# Patient Record
Sex: Male | Born: 1975
Health system: Southern US, Community
[De-identification: ages and names within clinical notes are randomized; demographics above are authoritative.]

## PROBLEM LIST (undated history)

## (undated) DIAGNOSIS — R51 Headache: Secondary | ICD-10-CM

## (undated) DIAGNOSIS — R519 Headache, unspecified: Secondary | ICD-10-CM

## (undated) DIAGNOSIS — I10 Essential (primary) hypertension: Secondary | ICD-10-CM

## (undated) HISTORY — DX: Essential (primary) hypertension: I10

## (undated) HISTORY — DX: Headache, unspecified: R51.9

## (undated) HISTORY — DX: Headache: R51

## (undated) HISTORY — PX: WISDOM TOOTH EXTRACTION: SHX21

## (undated) HISTORY — PX: NASAL SEPTUM SURGERY: SHX37

---

## 2004-08-06 ENCOUNTER — Ambulatory Visit (HOSPITAL_COMMUNITY): Admission: RE | Admit: 2004-08-06 | Discharge: 2004-08-06 | Payer: Self-pay | Admitting: Otolaryngology

## 2005-11-27 IMAGING — CT CT ORBIT/TEMPORAL/IAC W/O CM
1 of 5 series · 14 of 30 positions shown, 18 images · IV contrast (agent unspecified)
Comparison: none

CLINICAL DATA: 29-year-old with right ear drainage for 2 years.
 CT TEMPORAL BONES WITHOUT CONTRAST:
 High resolution CT imaging of the temporal bones was performed.  The external ear canal, tympanic membrane, and middle ear cavity appear normal on the right side.  The bony ossicles are intact.  There is no erosion of the scutum.  There are no soft tissue masses, and the epitympanum is clear.  The tegumen tympani is intact.  Specifically no evidence for cholesteatoma.  The inner ear structures appear normal.  There are some scattered mastoid air cells inferiorly that are opacified which may suggest mild chronic mastoiditis.  The mastoid antrum is clear.  The left temporal bone is normal in appearance.  The carotid canal and jugular foramen appear normal bilaterally.  The visualized paranasal sinuses are clear.

[Series 8533: — · axial · 0.35mm/px · z∈[-631,-522]mm · 14 of 729 slices shown, 18 images]
[im 46/729  brain]
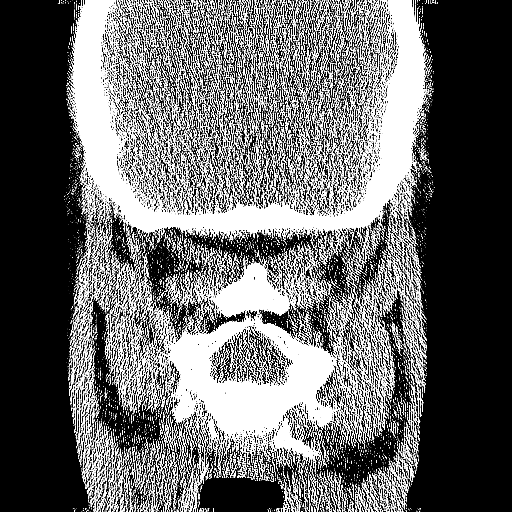
[im 46/729  bone]
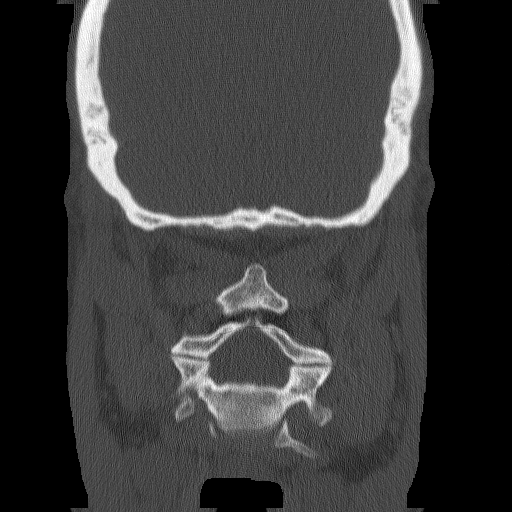
[im 92/729  bone]
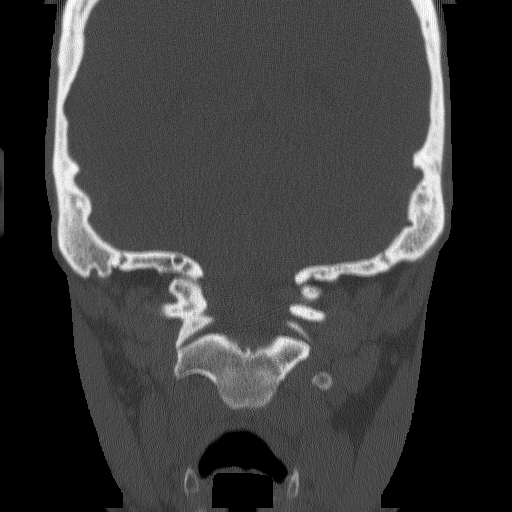
[im 137/729  bone]
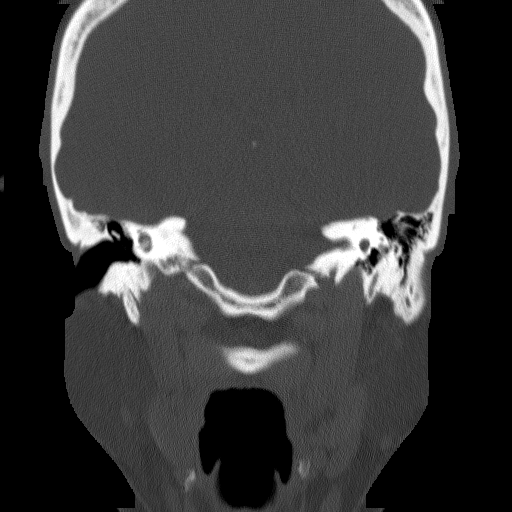
[im 183/729  bone]
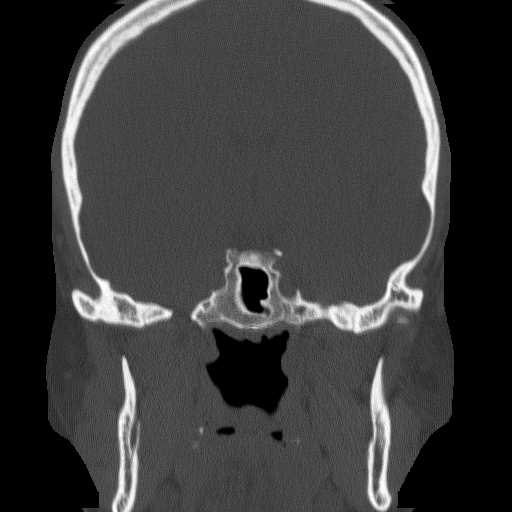
[im 228/729  brain]
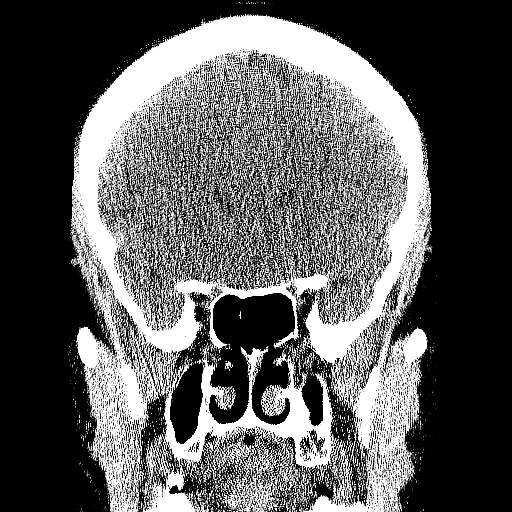
[im 228/729  bone]
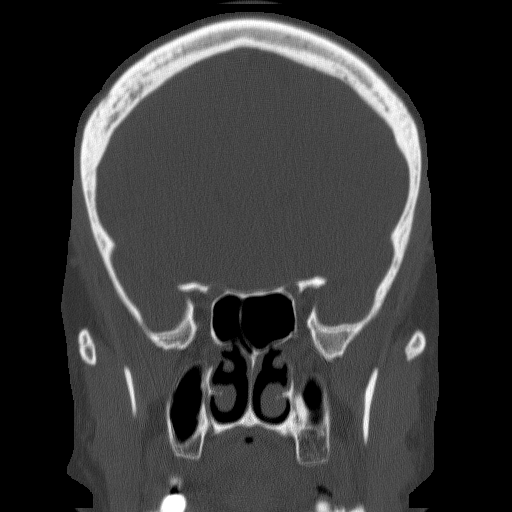
[im 274/729  bone]
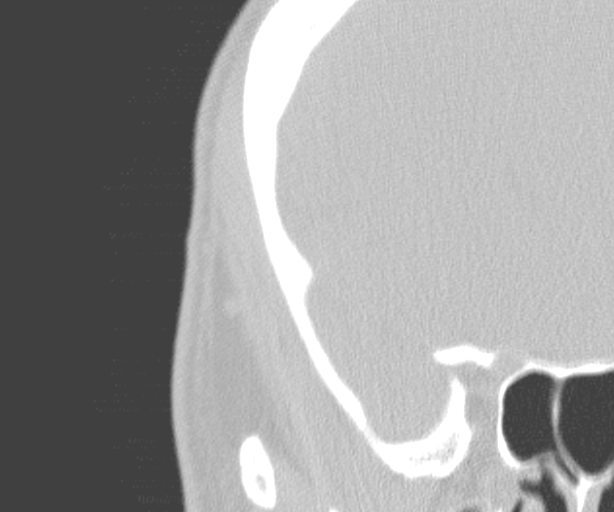
[im 319/729  bone]
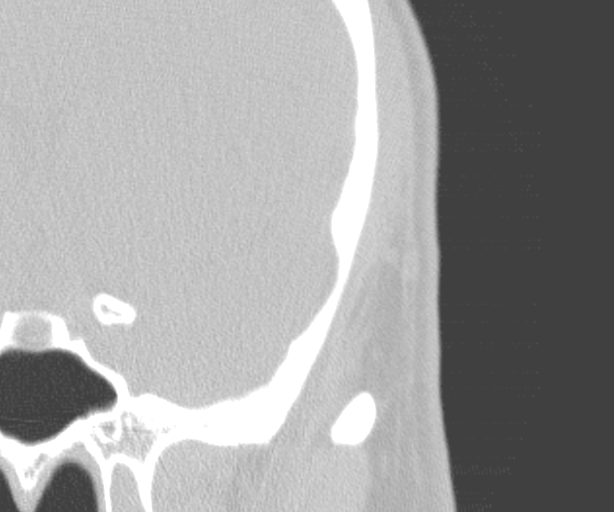
[im 410/729  bone]
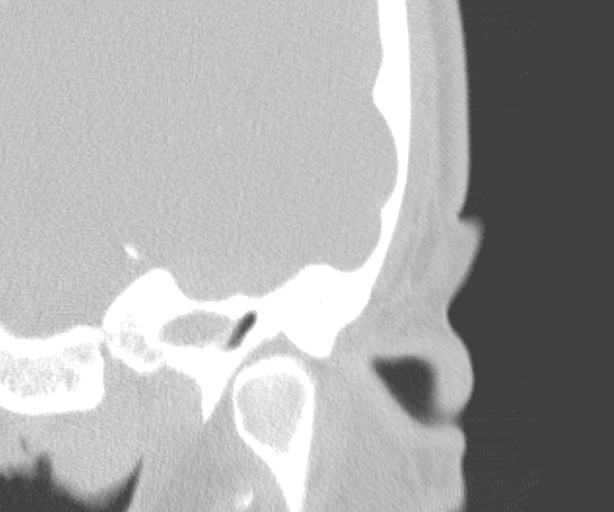
[im 456/729  brain]
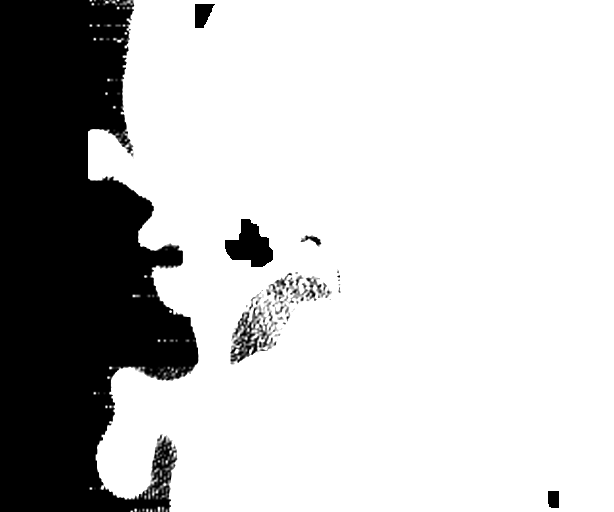
[im 456/729  bone]
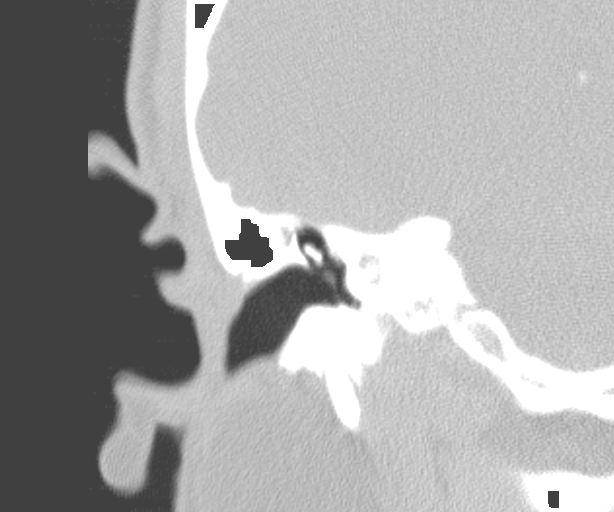
[im 501/729  bone]
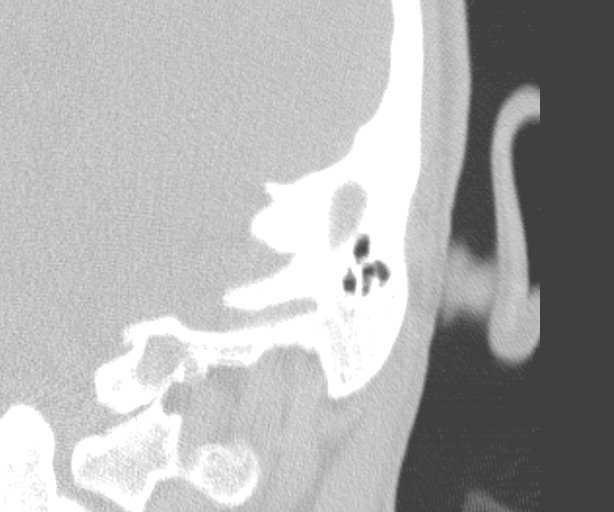
[im 547/729  bone]
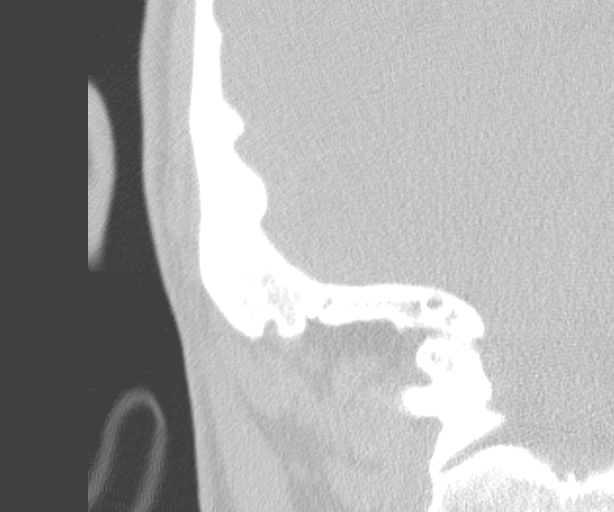
[im 592/729  bone]
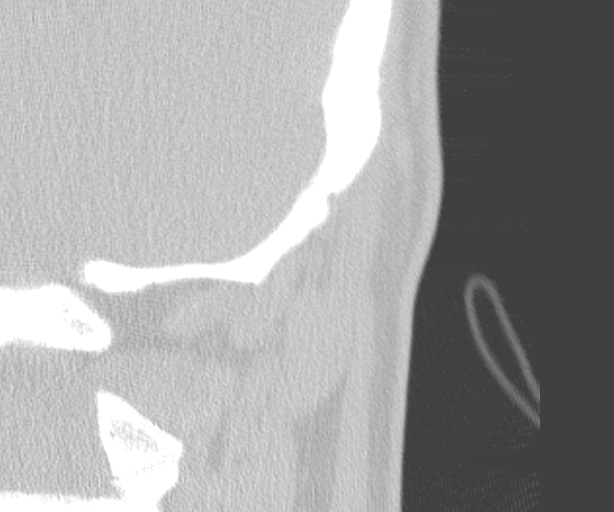
[im 638/729  brain]
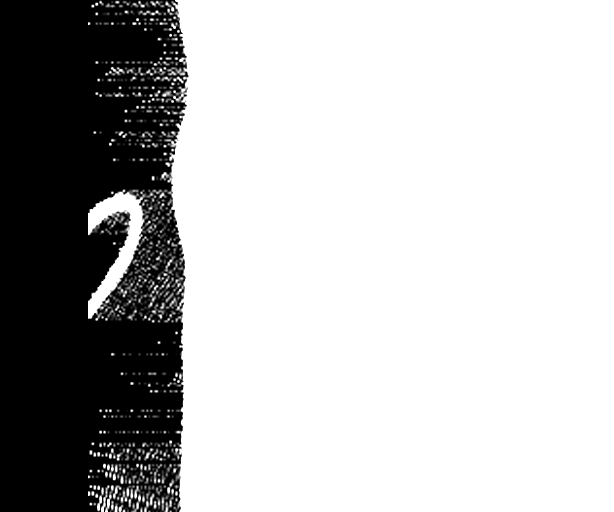
[im 638/729  bone]
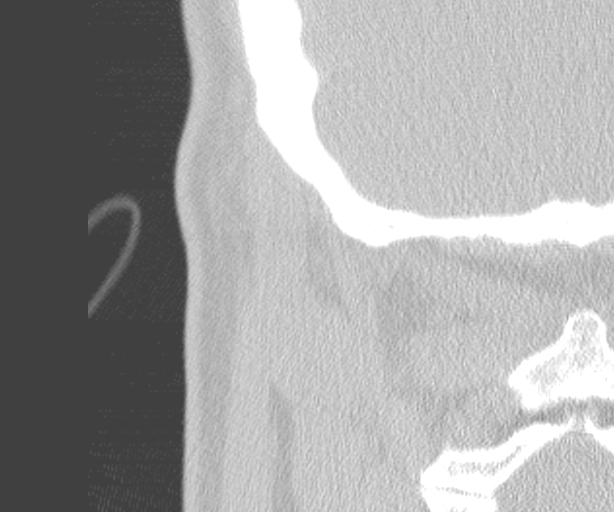
[im 683/729  bone]
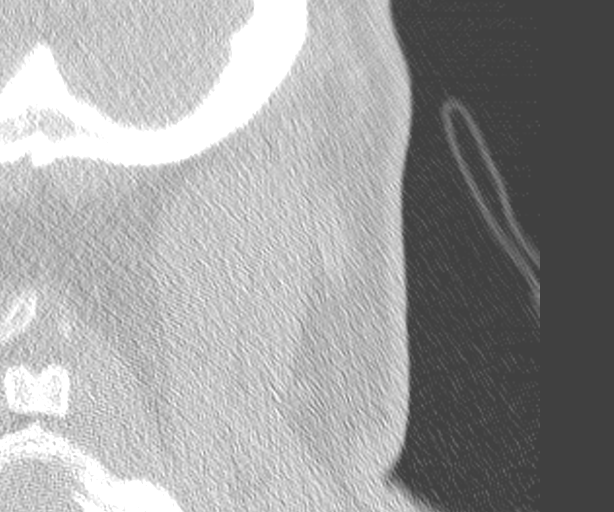

[14 of 30 positions shown; findings below may reference images not displayed]

IMPRESSION: 1.  Mild probable chronic mastoiditis inferiorly on the right side.  The mastoid antrum is clear.  The middle ear cavity appears normal.  
 2.  Normal appearance of the left temporal bone.

## 2017-06-01 ENCOUNTER — Encounter (INDEPENDENT_AMBULATORY_CARE_PROVIDER_SITE_OTHER): Payer: Self-pay

## 2017-06-01 ENCOUNTER — Ambulatory Visit: Payer: 59 | Admitting: Neurology

## 2017-06-01 ENCOUNTER — Encounter: Payer: Self-pay | Admitting: Neurology

## 2017-06-01 VITALS — BP 124/82 | HR 77 | Ht 71.0 in | Wt 241.0 lb

## 2017-06-01 DIAGNOSIS — G43009 Migraine without aura, not intractable, without status migrainosus: Secondary | ICD-10-CM | POA: Diagnosis not present

## 2017-06-01 DIAGNOSIS — R5383 Other fatigue: Secondary | ICD-10-CM | POA: Diagnosis not present

## 2017-06-01 DIAGNOSIS — G43709 Chronic migraine without aura, not intractable, without status migrainosus: Secondary | ICD-10-CM | POA: Diagnosis not present

## 2017-06-01 DIAGNOSIS — R0683 Snoring: Secondary | ICD-10-CM | POA: Diagnosis not present

## 2017-06-01 DIAGNOSIS — R519 Headache, unspecified: Secondary | ICD-10-CM

## 2017-06-01 DIAGNOSIS — R51 Headache with orthostatic component, not elsewhere classified: Secondary | ICD-10-CM

## 2017-06-01 DIAGNOSIS — G8929 Other chronic pain: Secondary | ICD-10-CM

## 2017-06-01 MED ORDER — TOPIRAMATE 50 MG PO TABS: 50.0000 mg | ORAL_TABLET | Freq: Every day | ORAL | 11 refills | Status: DC

## 2017-06-01 MED ORDER — RIZATRIPTAN BENZOATE 10 MG PO TABS: 10.0000 mg | ORAL_TABLET | ORAL | 11 refills | Status: DC | PRN

## 2017-06-01 NOTE — Progress Notes (Signed)
[facility]    Provider:  Dr Lucia Gaskins Referring Provider: Ignatius Specking, MD Primary Care Physician:  Ignatius Specking, MD  CC: Headaches  HPI:  Carlos Bell is a 42 y.o. male here as a referral from Dr. Sherril Croon for headaches.  Past medical history hypertension, depression and anxiety. 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 He has a constant headache in the last year, wakes up with it, goes to bed with it, daily headaches. No previous history of headaches. Topamax has helped. He was taking them 4-6x a day (discussed rebound headache). Topamax has helped. The headaches are still daily but improved with the Topamax. He always wakes up with a headache. He snores heavily at night. He is tired throughout the day, wakes up tired. The headaches are in the front and in the back, throbbing and pressure, he has significant light and sound sensitivity, has to lay down, movement makes it worse. Most days are moderate headaches and 8 or more are migrainous can last all day if untreated.   Reviewed notes, labs and imaging from outside physicians, which showed:  Personally reviewed images and agree with the following CT orbit/temporal: Clinical Data: 42 year old with right ear drainage for 2 years.  CT TEMPORAL BONES WITHOUT CONTRAST:  High resolution CT imaging of the temporal bones was performed. The external ear canal, tympanic membrane, and middle ear cavity appear normal on the right side. The bony ossicles are intact. There is no erosion of the scutum. There are no soft tissue masses, and the epitympanum is clear. The tegumen tympani is intact. Specifically no evidence for cholesteatoma. The inner ear structures appear normal. There are some scattered mastoid air cells inferiorly that are opacified which may suggest mild chronic mastoiditis. The mastoid antrum is clear. The left temporal bone is  normal in appearance. The carotid canal and jugular foramen appear normal bilaterally. The visualized paranasal sinuses are clear.  IMPRESSION:  1. Mild probable chronic mastoiditis inferiorly on the right side. The mastoid antrum is clear. The middle ear cavity appears normal.   2. Normal appearance of the left temporal bone.  Reviewed referring physician notes.  Patient presented headache intake.  Symptoms included headache and nausea, no vomiting have been ongoing for at least 11 hours.  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  Neurologic exam was normal  Review of Systems: Patient complains of symptoms per HPI as well as the following symptoms: headache. Pertinent negatives and positives per HPI. All others negative.   Social History   Socioeconomic History  . Marital status: Married    Spouse name: Not on file  . Number of children: Not on file  . Years of education: Not on file  . Highest education level: Not on file  Social Needs  . Financial resource strain: Not on file  . Food insecurity - worry: Not on file  . Food insecurity - inability: Not on file  . Transportation needs - medical: Not on file  . Transportation needs - non-medical: Not on file  Occupational History  . Not on file  Tobacco Use  . Smoking status: Never Smoker  . Smokeless tobacco: Never Used  Substance and Sexual Activity  . Alcohol use: Yes    Frequency: Never    Comment: occasional beer once a month  . Drug use: No  . Sexual activity:  Not on file  Other Topics Concern  . Not on file  Social History Narrative  . Not on file    Family History  Problem Relation Age of Onset  . Cervical cancer Mother   . Cancer Father     Past Medical History:  Diagnosis Date  . Headache   . Hypertension     Past Surgical History:  Procedure Laterality Date  . NASAL SEPTUM SURGERY    . WISDOM TOOTH EXTRACTION      Current Outpatient Medications  Medication Sig Dispense Refill  .  topiramate (TOPAMAX) 50 MG tablet Take 1 tablet (50 mg total) by mouth daily. 30 tablet 11  . rizatriptan (MAXALT) 10 MG tablet Take 1 tablet (10 mg total) by mouth as needed for migraine. repeat in 2 hours if needed. Max 2x in one day. Take at onset of migraines. 10 tablet 11   No current facility-administered medications for this visit.     Allergies as of 06/01/2017 - Review Complete 06/01/2017  Allergen Reaction Noted  . Zithromax [azithromycin] Rash 05/31/2017    Vitals: BP 124/82   Pulse 77   Ht 5\' 11"  (1.803 m)   Wt 241 lb (109.3 kg)   BMI 33.61 kg/m  Last Weight:  Wt Readings from Last 1 Encounters:  06/01/17 241 lb (109.3 kg)   Last Height:   Ht Readings from Last 1 Encounters:  06/01/17 5\' 11"  (1.803 m)   Physical exam: Exam: Gen: NAD, conversant, well nourised, obese, well groomed                     CV: RRR, no MRG. No Carotid Bruits. No peripheral edema, warm, nontender Eyes: Conjunctivae clear without exudates or hemorrhage  Neuro: Detailed Neurologic Exam  Speech:    Speech is normal; fluent and spontaneous with normal comprehension.  Cognition:    The patient is oriented to person, place, and time;     recent and remote memory intact;     language fluent;     normal attention, concentration,     fund of knowledge Cranial Nerves:    The pupils are equal, round, and reactive to light. The fundi are normal and spontaneous venous pulsations are present. Visual fields are full to finger confrontation. Extraocular movements are intact. Trigeminal sensation is intact and the muscles of mastication are normal. The face is symmetric. The palate elevates in the midline. Hearing intact. Voice is normal. Shoulder shrug is normal. The tongue has normal motion without fasciculations.   Coordination:    Normal finger to nose and heel to shin. Normal rapid alternating movements.   Gait:    Heel-toe and tandem gait are normal.   Motor Observation:    No asymmetry,  no atrophy, and no involuntary movements noted. Tone:    Normal muscle tone.    Posture:    Posture is normal. normal erect    Strength:    Strength is V/V in the upper and lower limbs.      Sensation: intact to LT     Reflex Exam:  DTR's:    Deep tendon reflexes in the upper and lower extremities are normal bilaterally.   Toes:    The toes are downgoing bilaterally.   Clonus:    Clonus is absent.       Assessment/Plan:  42 year old with intractable daily migraines and headaches, May be migraines however given concerning symptoms need full workup  Snoring, not sleeping well, morning  headaches, exhausted when he wakes up, obesity: sleep evaluation for OSA MRI brain due to positional, morning and occipital headaches to eval for space-occupying mass, lesion, chiari or other intracranial etiologies Labs today Increase Topiramate to 50mg  at bedtime Rizatriptan (Maxalt): Please take one tablet at the onset of your headache. If it does not improve the symptoms please take one additional tablet. Do not take more then 2 tablets in 24hrs. Do not take use more then 2 to 3 times in a week.  Discussed: To prevent or relieve headaches, try the following: Cool Compress. Lie down and place a cool compress on your head.  Avoid headache triggers. If certain foods or odors seem to have triggered your migraines in the past, avoid them. A headache diary might help you identify triggers.  Include physical activity in your daily routine. Try a daily walk or other moderate aerobic exercise.  Manage stress. Find healthy ways to cope with the stressors, such as delegating tasks on your to-do list.  Practice relaxation techniques. Try deep breathing, yoga, massage and visualization.  Eat regularly. Eating regularly scheduled meals and maintaining a healthy diet might help prevent headaches. Also, drink plenty of fluids.  Follow a regular sleep schedule. Sleep deprivation might contribute to  headaches Consider biofeedback. With this mind-body technique, you learn to control certain bodily functions - such as muscle tension, heart rate and blood pressure - to prevent headaches or reduce headache pain.    Proceed to emergency room if you experience new or worsening symptoms or symptoms do not resolve, if you have new neurologic symptoms or if headache is severe, or for any concerning symptom.   Provided education and documentation from American headache Society toolbox including articles on: chronic migraine medication overuse headache, chronic migraines, prevention of migraines, behavioral and other nonpharmacologic treatments for headache.  Cc: Dr. Sherril CroonVyas  Orders Placed This Encounter  Procedures  . MR BRAIN W WO CONTRAST  . Comprehensive metabolic panel  . CBC  . TSH  . Ambulatory referral to Sleep Studies     Naomie DeanAntonia Oaklee Sunga, MD  [facility] [address] [place], [place] [id number]  Phone [phone] Fax [phone]

## 2017-06-01 NOTE — Patient Instructions (Addendum)
Increase Topiramate to 50mg  at bedtime Rizatriptan (Maxalt): Please take one tablet at the onset of your headache. If it does not improve the symptoms please take one additional tablet. Do not take more then 2 tablets in 24hrs. Do not take use more then 2 to 3 times in a week. MRI brain Labs Refer to sleep doctor   Migraine Headache A migraine headache is an intense, throbbing pain on one side or both sides of the head. Migraines may also cause other symptoms, such as nausea, vomiting, and sensitivity to light and noise. What are the causes? Doing or taking certain things may also trigger migraines, such as:  Alcohol.  Smoking.  Medicines, such as: ? Medicine used to treat chest pain (nitroglycerine). ? Birth control pills. ? Estrogen pills. ? Certain blood pressure medicines.  Aged cheeses, chocolate, or caffeine.  Foods or drinks that contain nitrates, glutamate, aspartame, or tyramine.  Physical activity.  Other things that may trigger a migraine include:  Menstruation.  Pregnancy.  Hunger.  Stress, lack of sleep, too much sleep, or fatigue.  Weather changes.  What increases the risk? The following factors may make you more likely to experience migraine headaches:  Age. Risk increases with age.  Family history of migraine headaches.  Being Caucasian.  Depression and anxiety.  Obesity.  Being a woman.  Having a hole in the heart (patent foramen ovale) or other heart problems.  What are the signs or symptoms? The main symptom of this condition is pulsating or throbbing pain. Pain may:  Happen in any area of the head, such as on one side or both sides.  Interfere with daily activities.  Get worse with physical activity.  Get worse with exposure to bright lights or loud noises.  Other symptoms may include:  Nausea.  Vomiting.  Dizziness.  General sensitivity to bright lights, loud noises, or smells.  Before you get a migraine, you may get  warning signs that a migraine is developing (aura). An aura may include:  Seeing flashing lights or having blind spots.  Seeing bright spots, halos, or zigzag lines.  Having tunnel vision or blurred vision.  Having numbness or a tingling feeling.  Having trouble talking.  Having muscle weakness.  How is this diagnosed? A migraine headache can be diagnosed based on:  Your symptoms.  A physical exam.  Tests, such as CT scan or MRI of the head. These imaging tests can help rule out other causes of headaches.  Taking fluid from the spine (lumbar puncture) and analyzing it (cerebrospinal fluid analysis, or CSF analysis).  How is this treated? A migraine headache is usually treated with medicines that:  Relieve pain.  Relieve nausea.  Prevent migraines from coming back.  Treatment may also include:  Acupuncture.  Lifestyle changes like avoiding foods that trigger migraines.  Follow these instructions at home: Medicines  Take over-the-counter and prescription medicines only as told by your health care provider.  Do not drive or use heavy machinery while taking prescription pain medicine.  To prevent or treat constipation while you are taking prescription pain medicine, your health care provider may recommend that you: ? Drink enough fluid to keep your urine clear or pale yellow. ? Take over-the-counter or prescription medicines. ? Eat foods that are high in fiber, such as fresh fruits and vegetables, whole grains, and beans. ? Limit foods that are high in fat and processed sugars, such as fried and sweet foods. Lifestyle  Avoid alcohol use.  Do not use  any products that contain nicotine or tobacco, such as cigarettes and e-cigarettes. If you need help quitting, ask your health care provider.  Get at least 8 hours of sleep every night.  Limit your stress. General instructions   Keep a journal to find out what may trigger your migraine headaches. For example,  write down: ? What you eat and drink. ? How much sleep you get. ? Any change to your diet or medicines.  If you have a migraine: ? Avoid things that make your symptoms worse, such as bright lights. ? It may help to lie down in a dark, quiet room. ? Do not drive or use heavy machinery. ? Ask your health care provider what activities are safe for you while you are experiencing symptoms.  Keep all follow-up visits as told by your health care provider. This is important. Contact a health care provider if:  You develop symptoms that are different or more severe than your usual migraine symptoms. Get help right away if:  Your migraine becomes severe.  You have a fever.  You have a stiff neck.  You have vision loss.  Your muscles feel weak or like you cannot control them.  You start to lose your balance often.  You develop trouble walking.  You faint. This information is not intended to replace advice given to you by your health care provider. Make sure you discuss any questions you have with your health care provider. Document Released: 03/21/2005 Document Revised: 10/09/2015 Document Reviewed: 09/07/2015 Elsevier Interactive Patient Education  2017 Elsevier Inc.  Rizatriptan tablets What is this medicine? RIZATRIPTAN (rye za TRIP tan) is used to treat migraines with or without aura. An aura is a strange feeling or visual disturbance that warns you of an attack. It is not used to prevent migraines. This medicine may be used for other purposes; ask your health care provider or pharmacist if you have questions. COMMON BRAND NAME(S): Maxalt What should I tell my health care provider before I take this medicine? They need to know if you have any of these conditions: -bowel disease or colitis -diabetes -family history of heart disease -fast or irregular heart beat -heart or blood vessel disease, angina (chest pain), or previous heart attack -high blood pressure -high  cholesterol -history of stroke, transient ischemic attacks (TIAs or mini-strokes), or intracranial bleeding -kidney or liver disease -overweight -poor circulation -postmenopausal or surgical removal of uterus and ovaries -Raynaud's disease -seizure disorder -an unusual or allergic reaction to rizatriptan, other medicines, foods, dyes, or preservatives -pregnant or trying to get pregnant -breast-feeding How should I use this medicine? This medicine is taken by mouth with a glass of water. Follow the directions on the prescription label. This medicine is taken at the first symptoms of a migraine. It is not for everyday use. If your migraine headache returns after one dose, you can take another dose as directed. You must leave at least 2 hours between doses, and do not take more than 30 mg total in 24 hours. If there is no improvement at all after the first dose, do not take a second dose without talking to your doctor or health care professional. Do not take your medicine more often than directed. Talk to your pediatrician regarding the use of this medicine in children. While this drug may be prescribed for children as young as 6 years for selected conditions, precautions do apply. Overdosage: If you think you have taken too much of this medicine contact a poison control  center or emergency room at once. NOTE: This medicine is only for you. Do not share this medicine with others. What if I miss a dose? This does not apply; this medicine is not for regular use. What may interact with this medicine? Do not take this medicine with any of the following medicines: -amphetamine, dextroamphetamine or cocaine -dihydroergotamine, ergotamine, ergoloid mesylates, methysergide, or ergot-type medication - do not take within 24 hours of taking rizatriptan -feverfew -MAOIs like Carbex, Eldepryl, Marplan, Nardil, and Parnate - do not take rizatriptan within 2 weeks of stopping MAOI therapy. -other migraine  medicines like almotriptan, eletriptan, naratriptan, sumatriptan, zolmitriptan - do not take within 24 hours of taking rizatriptan -tryptophan This medicine may also interact with the following medications: -medicines for mental depression, anxiety or mood problems -propranolol This list may not describe all possible interactions. Give your health care provider a list of all the medicines, herbs, non-prescription drugs, or dietary supplements you use. Also tell them if you smoke, drink alcohol, or use illegal drugs. Some items may interact with your medicine. What should I watch for while using this medicine? Only take this medicine for a migraine headache. Take it if you get warning symptoms or at the start of a migraine attack. It is not for regular use to prevent migraine attacks. You may get drowsy or dizzy. Do not drive, use machinery, or do anything that needs mental alertness until you know how this medicine affects you. To reduce dizzy or fainting spells, do not sit or stand up quickly, especially if you are an older patient. Alcohol can increase drowsiness, dizziness and flushing. Avoid alcoholic drinks. Smoking cigarettes may increase the risk of heart-related side effects from using this medicine. If you take migraine medicines for 10 or more days a month, your migraines may get worse. Keep a diary of headache days and medicine use. Contact your healthcare professional if your migraine attacks occur more frequently. What side effects may I notice from receiving this medicine? Side effects that you should report to your doctor or health care professional as soon as possible: -allergic reactions like skin rash, itching or hives, swelling of the face, lips, or tongue -fast, slow, or irregular heart beat -increased or decreased blood pressure -seizures -severe stomach pain and cramping, bloody diarrhea -signs and symptoms of a blood clot such as breathing problems; changes in vision; chest  pain; severe, sudden headache; pain, swelling, warmth in the leg; trouble speaking; sudden numbness or weakness of the face, arm or leg -tingling, pain, or numbness in the face, hands, or feet Side effects that usually do not require medical attention (report to your doctor or health care professional if they continue or are bothersome): -drowsiness -dry mouth -feeling warm, flushing, or redness of the face -headache -muscle cramps, pain -nausea, vomiting -unusually weak or tired This list may not describe all possible side effects. Call your doctor for medical advice about side effects. You may report side effects to FDA at 1-800-FDA-1088. Where should I keep my medicine? Keep out of the reach of children. Store at room temperature between 15 and 30 degrees C (59 and 86 degrees F). Keep container tightly closed. Throw away any unused medicine after the expiration date. NOTE: This sheet is a summary. It may not cover all possible information. If you have questions about this medicine, talk to your doctor, pharmacist, or health care provider.  2018 Elsevier/Gold Standard (2012-11-20 10:16:39)

## 2017-06-02 ENCOUNTER — Telehealth: Payer: Self-pay | Admitting: *Deleted

## 2017-06-02 LAB — COMPREHENSIVE METABOLIC PANEL
ALT: 27 IU/L (ref 0–44)
AST: 31 IU/L (ref 0–40)
Albumin/Globulin Ratio: 2 (ref 1.2–2.2)
Albumin: 4.3 g/dL (ref 3.5–5.5)
Alkaline Phosphatase: 79 IU/L (ref 39–117)
BILIRUBIN TOTAL: 0.4 mg/dL (ref 0.0–1.2)
BUN/Creatinine Ratio: 17 (ref 9–20)
BUN: 18 mg/dL (ref 6–24)
CALCIUM: 9.2 mg/dL (ref 8.7–10.2)
CHLORIDE: 109 mmol/L — AB (ref 96–106)
CO2: 22 mmol/L (ref 20–29)
Creatinine, Ser: 1.09 mg/dL (ref 0.76–1.27)
GFR, EST AFRICAN AMERICAN: 96 mL/min/{1.73_m2} (ref >59)
GFR, EST NON AFRICAN AMERICAN: 83 mL/min/{1.73_m2} (ref >59)
GLUCOSE: 114 mg/dL — AB (ref 65–99)
Globulin, Total: 2.2 g/dL (ref 1.5–4.5)
Potassium: 4.4 mmol/L (ref 3.5–5.2)
Sodium: 147 mmol/L — ABNORMAL HIGH (ref 134–144)
TOTAL PROTEIN: 6.5 g/dL (ref 6.0–8.5)

## 2017-06-02 LAB — CBC
HEMATOCRIT: 43.2 % (ref 37.5–51.0)
HEMOGLOBIN: 14.6 g/dL (ref 13.0–17.7)
MCH: 27.9 pg (ref 26.6–33.0)
MCHC: 33.8 g/dL (ref 31.5–35.7)
MCV: 83 fL (ref 79–97)
Platelets: 194 10*3/uL (ref 150–379)
RBC: 5.23 x10E6/uL (ref 4.14–5.80)
RDW: 14.4 % (ref 12.3–15.4)
WBC: 7.9 10*3/uL (ref 3.4–10.8)

## 2017-06-02 LAB — TSH: TSH: 0.616 u[IU]/mL (ref 0.450–4.500)

## 2017-06-02 NOTE — Telephone Encounter (Signed)
Spoke with patient. He is aware labs were unremarkable. He specifically asked about TSH, which was normal. Questions answered. He verbalized appreciation.

## 2017-06-02 NOTE — Telephone Encounter (Signed)
-----   Message from Anson FretAntonia B Ahern, MD sent at 06/02/2017 10:44 AM EST ----- Labs unremarkable

## 2017-06-07 ENCOUNTER — Ambulatory Visit (INDEPENDENT_AMBULATORY_CARE_PROVIDER_SITE_OTHER): Payer: 59

## 2017-06-07 DIAGNOSIS — G8929 Other chronic pain: Secondary | ICD-10-CM

## 2017-06-07 DIAGNOSIS — R51 Headache with orthostatic component, not elsewhere classified: Secondary | ICD-10-CM

## 2017-06-07 DIAGNOSIS — R519 Headache, unspecified: Secondary | ICD-10-CM

## 2017-06-07 MED ORDER — GADOPENTETATE DIMEGLUMINE 469.01 MG/ML IV SOLN: 20.0000 mL | Freq: Once | INTRAVENOUS | Status: DC | PRN

## 2017-06-08 ENCOUNTER — Telehealth: Payer: Self-pay | Admitting: *Deleted

## 2017-06-08 NOTE — Telephone Encounter (Signed)
Called pt and discussed that MRI brain is normal. He verbalized understanding and appreciation.

## 2017-06-08 NOTE — Telephone Encounter (Signed)
-----   Message from Anson FretAntonia B Ahern, MD sent at 06/08/2017 12:58 PM EST ----- MRI brain normal

## 2017-07-05 ENCOUNTER — Institutional Professional Consult (permissible substitution): Payer: 59 | Admitting: Neurology

## 2017-10-02 ENCOUNTER — Telehealth: Payer: Self-pay | Admitting: Neurology

## 2017-10-02 MED ORDER — TOPIRAMATE 100 MG PO TABS: 100.0000 mg | ORAL_TABLET | Freq: Every day | ORAL | 8 refills | Status: DC

## 2017-10-02 NOTE — Telephone Encounter (Signed)
Called the patient and informed him that Dr Lucia GaskinsAhern was ok with increasing the dose of the topamax. The dose has been increased to 100 mg at bedtime. [facility]. Pt verbalized understanding. Pt had no questions at this time but was encouraged to call back if questions arise.

## 2017-10-02 NOTE — Telephone Encounter (Signed)
Pt said HA's have started to return over the past 2-3 weeks. He is wanting to know if topiramate (TOPAMAX) 50 MG tablet could be increased. Please call to advise

## 2017-11-29 ENCOUNTER — Ambulatory Visit: Payer: 59 | Admitting: Neurology

## 2017-12-13 ENCOUNTER — Encounter: Payer: Self-pay | Admitting: Neurology

## 2017-12-13 ENCOUNTER — Ambulatory Visit: Payer: 59 | Admitting: Neurology

## 2017-12-13 VITALS — BP 121/79 | HR 67 | Ht 71.0 in | Wt 235.0 lb

## 2017-12-13 DIAGNOSIS — G43009 Migraine without aura, not intractable, without status migrainosus: Secondary | ICD-10-CM

## 2017-12-13 DIAGNOSIS — G43711 Chronic migraine without aura, intractable, with status migrainosus: Secondary | ICD-10-CM

## 2017-12-13 MED ORDER — AMITRIPTYLINE HCL 10 MG PO TABS
10.0000 mg | ORAL_TABLET | Freq: Every day | ORAL | 11 refills | Status: DC
Start: 2017-12-13 — End: 2020-01-16

## 2017-12-13 MED ORDER — TOPIRAMATE 50 MG PO TABS: 50.0000 mg | ORAL_TABLET | Freq: Every day | ORAL | 4 refills | Status: DC

## 2017-12-13 MED ORDER — ERENUMAB-AOOE 140 MG/ML ~~LOC~~ SOAJ: 140.0000 mg | SUBCUTANEOUS | 11 refills | Status: DC

## 2017-12-13 NOTE — Progress Notes (Signed)
[facility]    Provider:  Dr Lucia Gaskins Referring Provider: Ignatius Specking, MD Primary Care Physician:  Ignatius Specking, MD  CC: Headaches  Interval history 12/13/2017:  Recommended a sleep study for OSA but he did not schedule and declined when we called. Discussed again he needs a sleep test if he has morning headaches. He decreased Topamax to 50mg . He is under a lot of stress, he snores, is tired, wakes frequently. Topamax helped with headaches but having side effects.  He also has problems sleeping, can fall asleep at the "drop of a hat". He is in bed by 10 and wakes 7am. His brain is going all night never feels rested in the morning. Continued headaches.  HPI:  Carlos Bell is a 42 y.o. male here as a referral from Dr. Sherril Croon for headaches.  Past medical history hypertension, depression and anxiety. 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 He has a constant headache in the last year, wakes up with it, goes to bed with it, daily headaches. No previous history of headaches. Topamax has helped. He was taking them 4-6x a day (discussed rebound headache). Topamax has helped. The headaches are still daily but improved with the Topamax. He always wakes up with a headache. He snores heavily at night. He is tired throughout the day, wakes up tired. The headaches are in the front and in the back, throbbing and pressure, he has significant light and sound sensitivity, has to lay down, movement makes it worse. Most days are moderate headaches and 8 or more are migrainous can last all day if untreated.   Reviewed notes, labs and imaging from outside physicians, which showed:  Personally reviewed images and agree with the following CT orbit/temporal: Clinical Data: 42 year old with right ear drainage for 2 years.  CT TEMPORAL BONES WITHOUT CONTRAST:  High resolution CT imaging of the temporal bones  was performed. The external ear canal, tympanic membrane, and middle ear cavity appear normal on the right side. The bony ossicles are intact. There is no erosion of the scutum. There are no soft tissue masses, and the epitympanum is clear. The tegumen tympani is intact. Specifically no evidence for cholesteatoma. The inner ear structures appear normal. There are some scattered mastoid air cells inferiorly that are opacified which may suggest mild chronic mastoiditis. The mastoid antrum is clear. The left temporal bone is normal in appearance. The carotid canal and jugular foramen appear normal bilaterally. The visualized paranasal sinuses are clear.  IMPRESSION:  1. Mild probable chronic mastoiditis inferiorly on the right side. The mastoid antrum is clear. The middle ear cavity appears normal.   2. Normal appearance of the left temporal bone.  Reviewed referring physician notes.  Patient presented headache intake.  Symptoms included headache and nausea, no vomiting have been ongoing for at least 11 hours.  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  Neurologic exam was normal  Review of Systems: Patient complains of symptoms per HPI as well as the following symptoms: headache. Pertinent negatives and positives per HPI. All others negative.   Social History   Socioeconomic History  . Marital status: Married    Spouse name: Not on file  . Number of children: 1  . Years of education: Not on file  . Highest education level: High school graduate  Occupational History  . Not on file  Social Needs  . Financial resource strain: Not on file  . Food insecurity:    Worry: Not on file    Inability: Not on file  . Transportation needs:    Medical: Not on file    Non-medical: Not on file  Tobacco Use  . Smoking status: Never Smoker  . Smokeless tobacco: Never Used  Substance and Sexual Activity  . Alcohol use: Yes    Frequency: Never    Comment: occasional beer once a month  .  Drug use: No  . Sexual activity: Not on file  Lifestyle  . Physical activity:    Days per week: Not on file    Minutes per session: Not on file  . Stress: Not on file  Relationships  . Social connections:    Talks on phone: Not on file    Gets together: Not on file    Attends religious service: Not on file    Active member of club or organization: Not on file    Attends meetings of clubs or organizations: Not on file    Relationship status: Not on file  . Intimate partner violence:    Fear of current or ex partner: Not on file    Emotionally abused: Not on file    Physically abused: Not on file    Forced sexual activity: Not on file  Other Topics Concern  . Not on file  Social History Narrative   Lives at home with his wife and daughter   Right handed   Caffeine: 4 cups daily    Family History  Problem Relation Age of Onset  . Cervical cancer Mother   . Cancer Father     Past Medical History:  Diagnosis Date  . Headache   . Hypertension     Past Surgical History:  Procedure Laterality Date  . NASAL SEPTUM SURGERY    . WISDOM TOOTH EXTRACTION      Current Outpatient Medications  Medication Sig Dispense Refill  . rizatriptan (MAXALT) 10 MG tablet Take 1 tablet (10 mg total) by mouth as needed for migraine. repeat in 2 hours if needed. Max 2x in one day. Take at onset of migraines. 10 tablet 11  . topiramate (TOPAMAX) 100 MG tablet Take 1 tablet (100 mg total) by mouth at bedtime. (Patient taking differently: Take 50 mg by mouth at bedtime. ) 30 tablet 8   No current facility-administered medications for this visit.    Facility-Administered Medications Ordered in Other Visits  Medication Dose Route Frequency Provider Last Rate Last Dose  . gadopentetate dimeglumine (MAGNEVIST) injection 20 mL  20 mL Intravenous Once PRN Anson Fret, MD        Allergies as of 12/13/2017 - Review Complete 12/13/2017  Allergen Reaction Noted  . Zithromax [azithromycin] Rash  05/31/2017    Vitals: BP 121/79 (BP Location: Right Arm, Patient Position: Sitting)   Pulse 67   Ht 5\' 11"  (1.803 m)   Wt 235 lb (106.6 kg)   BMI 32.78 kg/m  Last Weight:  Wt Readings from Last 1 Encounters:  12/13/17 235 lb (106.6 kg)   Last Height:   Ht Readings from Last 1 Encounters:  12/13/17 5\' 11"  (1.803 m)   Physical exam: Exam: Gen: NAD, conversant, well nourised, obese, well groomed                     CV: RRR, no MRG. No Carotid Bruits. No peripheral edema, warm, nontender Eyes: Conjunctivae clear  without exudates or hemorrhage  Neuro: Detailed Neurologic Exam  Speech:    Speech is normal; fluent and spontaneous with normal comprehension.  Cognition:    The patient is oriented to person, place, and time;     recent and remote memory intact;     language fluent;     normal attention, concentration,     fund of knowledge Cranial Nerves:    The pupils are equal, round, and reactive to light. The fundi are normal and spontaneous venous pulsations are present. Visual fields are full to finger confrontation. Extraocular movements are intact. Trigeminal sensation is intact and the muscles of mastication are normal. The face is symmetric. The palate elevates in the midline. Hearing intact. Voice is normal. Shoulder shrug is normal. The tongue has normal motion without fasciculations.   Coordination:    Normal finger to nose and heel to shin. Normal rapid alternating movements.   Gait:    Heel-toe and tandem gait are normal.   Motor Observation:    No asymmetry, no atrophy, and no involuntary movements noted. Tone:    Normal muscle tone.    Posture:    Posture is normal. normal erect    Strength:    Strength is V/V in the upper and lower limbs.      Sensation: intact to LT     Reflex Exam:  DTR's:    Deep tendon reflexes in the upper and lower extremities are normal bilaterally.   Toes:    The toes are downgoing bilaterally.   Clonus:    Clonus is  absent.       Assessment/Plan:  42 year old with intractable daily migraines and headaches.   MRI brain : reviewed images with patient, normal Start Aimovig once monthly injection Start Amitriptyline 10mg  at bedtime or an hour before bed to help with migraine and sleeping In one month if feeling better decrease Topamax to 25mg  and then in another month stop it Recommend sleep evaluation, discussed again for morning headaches, fatigue and snoring but he declines, discussed sequelae of untreated sleep apnea Continue Rizatriptan (Maxalt): Please take one tablet at the onset of your headache. If it does not improve the symptoms please take one additional tablet. Do not take more then 2 tablets in 24hrs. Do not take use more then 2 to 3 times in a week. Discussed medication overuse again Denies latex allergy  Meds ordered this encounter  Medications  . topiramate (TOPAMAX) 50 MG tablet    Sig: Take 1 tablet (50 mg total) by mouth at bedtime.    Dispense:  90 tablet    Refill:  4  . amitriptyline (ELAVIL) 10 MG tablet    Sig: Take 1 tablet (10 mg total) by mouth at bedtime.    Dispense:  30 tablet    Refill:  11  . Erenumab-aooe (AIMOVIG) 140 MG/ML SOAJ    Sig: Inject 140 mg into the skin every 30 (thirty) days.    Dispense:  1 pen    Refill:  11    Patient has a copay card can get medication regardless of insurance approval     Discussed: To prevent or relieve headaches, try the following: Cool Compress. Lie down and place a cool compress on your head.  Avoid headache triggers. If certain foods or odors seem to have triggered your migraines in the past, avoid them. A headache diary might help you identify triggers.  Include physical activity in your daily routine. Try a daily walk  or other moderate aerobic exercise.  Manage stress. Find healthy ways to cope with the stressors, such as delegating tasks on your to-do list.  Practice relaxation techniques. Try deep breathing, yoga,  massage and visualization.  Eat regularly. Eating regularly scheduled meals and maintaining a healthy diet might help prevent headaches. Also, drink plenty of fluids.  Follow a regular sleep schedule. Sleep deprivation might contribute to headaches Consider biofeedback. With this mind-body technique, you learn to control certain bodily functions - such as muscle tension, heart rate and blood pressure - to prevent headaches or reduce headache pain.    Proceed to emergency room if you experience new or worsening symptoms or symptoms do not resolve, if you have new neurologic symptoms or if headache is severe, or for any concerning symptom.   Provided education and documentation from American headache Society toolbox including articles on: chronic migraine medication overuse headache, chronic migraines, prevention of migraines, behavioral and other nonpharmacologic treatments for headache.  Cc: Dr. Genia Harold, MD  [facility] [address] [place], [place] [address]  Phone [phone] Fax [phone]  A total of 25 minutes was spent face-to-face with this patient. Over half this time was spent on counseling patient on the  1. Chronic migraine without aura, with intractable migraine, so stated, with status migrainosus     diagnosis and different diagnostic and therapeutic options, counseling and coordination of care, risks ans benefits of management, compliance, or risk factor reduction and education.

## 2017-12-13 NOTE — Patient Instructions (Addendum)
Start Aimovig once monthly injection  Start Amitriptyline 10mg  at bedtime or an hour before bed (helps with migraines and will help with sleep) In one month if feeling better decrease Topamax to 25mg  and then in another month stop it Recommend sleep evaluation(?)   Sleep Apnea Sleep apnea is a condition that affects breathing. People with sleep apnea have moments during sleep when their breathing pauses briefly or gets shallow. Sleep apnea can cause these symptoms:  Trouble staying asleep.  Sleepiness or tiredness during the day.  Irritability.  Loud snoring.  Morning headaches.  Trouble concentrating.  Forgetting things.  Less interest in sex.  Being sleepy for no reason.  Mood swings.  Personality changes.  Depression.  Waking up a lot during the night to pee (urinate).  Dry mouth.  Sore throat.  MORNING HEADACHES  Follow these instructions at home:  Make any changes in your routine that your doctor recommends.  Eat a healthy, well-balanced diet.  Take over-the-counter and prescription medicines only as told by your doctor.  Avoid using alcohol, calming medicines (sedatives), and narcotic medicines.  Take steps to lose weight if you are overweight.  If you were given a machine (device) to use while you sleep, use it only as told by your doctor.  Do not use any tobacco products, such as cigarettes, chewing tobacco, and e-cigarettes. If you need help quitting, ask your doctor.  Keep all follow-up visits as told by your doctor. This is important. Contact a doctor if:  The machine that you were given to use during sleep is uncomfortable or does not seem to be working.  Your symptoms do not get better.  Your symptoms get worse. Get help right away if:  Your chest hurts.  You have trouble breathing in enough air (shortness of breath).  You have an uncomfortable feeling in your back, arms, or stomach.  You have trouble talking.  One side of your  body feels weak.  A part of your face is hanging down (drooping). These symptoms may be an emergency. Do not wait to see if the symptoms will go away. Get medical help right away. Call your local emergency services (911 in the [place] Do not drive yourself to the hospital. This information is not intended to replace advice given to you by your health care provider. Make sure you discuss any questions you have with your health care provider. Document Released: 12/29/2007 Document Revised: 11/15/2015 Document Reviewed: 12/29/2014 Elsevier Interactive Patient Education  2018 [employer].  [place]: Patient drug information [employer] Online here. Copyright 9365144330 [employer] All rights reserved. (For additional information see "Erenumab: Drug information") Brand Names: [place]  Aimovig  Brand Names: [place]  Aimovig  What is this drug used for?   It is used to prevent migraine headaches.  What do I need to tell my doctor BEFORE I take this drug?   If you have an allergy to this drug or any part of this drug.   If you are allergic to any drugs like this one, any other drugs, foods, or other substances. Tell your doctor about the allergy and what signs you had, like rash; hives; itching; shortness of breath; wheezing; cough; swelling of face, lips, tongue, or throat; or any other signs.   This drug may interact with other drugs or health problems.   Tell your doctor and pharmacist about all of your drugs (prescription or OTC, natural products, vitamins) and health problems. You must check to make sure that it  is safe for you to take this drug with all of your drugs and health problems. Do not start, stop, or change the dose of any drug without checking with your doctor.  What are some things I need to know or do while I take this drug?   Tell all of your health care providers that you take this drug. This includes your doctors, nurses, pharmacists, and dentists.   If you have a latex  allergy, talk with your doctor.   Tell your doctor if you are pregnant or plan on getting pregnant. You will need to talk about the benefits and risks of using this drug while you are pregnant.   Tell your doctor if you are breast-feeding. You will need to talk about any risks to your baby.  What are some side effects that I need to call my doctor about right away?   WARNING/CAUTION: Even though it may be rare, some people may have very bad and sometimes deadly side effects when taking a drug. Tell your doctor or get medical help right away if you have any of the following signs or symptoms that may be related to a very bad side effect:   Signs of an allergic reaction, like rash; hives; itching; red, swollen, blistered, or peeling skin with or without fever; wheezing; tightness in the chest or throat; trouble breathing, swallowing, or talking; unusual hoarseness; or swelling of the mouth, face, lips, tongue, or throat.  What are some other side effects of this drug?   All drugs may cause side effects. However, many people have no side effects or only have minor side effects. Call your doctor or get medical help if any of these side effects or any other side effects bother you or do not go away:   Redness or swelling where the shot is given.   Pain where the shot was given.   Constipation.   These are not all of the side effects that may occur. If you have questions about side effects, call your doctor. Call your doctor for medical advice about side effects.   You may report side effects to your national health agency.  How is this drug best taken?   Use this drug as ordered by your doctor. Read all information given to you. Follow all instructions closely.   It is given as a shot into the fatty part of the skin on the top of the thigh, belly area, or upper arm.   If you will be giving yourself the shot, your doctor or nurse will teach you how to give the shot.   Follow how to use as you have been  told by the doctor or read the package insert.   If stored in a refrigerator, let this drug come to room temperature before using it. Leave it at room temperature for at least 30 minutes. Do not heat this drug.   Protect from heat and sunlight.   Do not shake.   Do not give into skin that is irritated, bruised, red, infected, or scarred.   Do not use if the solution is cloudy, leaking, or has particles.   Do not use if solution changes color.   Throw away after using. Do not use the device more than 1 time.   Throw away needles in a needle/sharp disposal box. Do not reuse needles or other items. When the box is full, follow all local rules for getting rid of it. Talk with a doctor or pharmacist if  you have any questions.  What do I do if I miss a dose?   Take a missed dose as soon as you think about it.   After taking a missed dose, start a new schedule based on when the dose is taken.  How do I store and/or throw out this drug?   Store in a refrigerator. Do not freeze.   Store in the carton to protect from light.   Do not use if it has been frozen.   If you drop this drug on a hard surface, do not use it.   If needed, you may store at room temperature for up to 7 days. Write down the date you take this drug out of the refrigerator. If stored at room temperature and not used within 7 days, throw this drug away.   Do not put this drug back in the refrigerator after it has been stored at room temperature.   Keep all drugs in a safe place. Keep all drugs out of the reach of children and pets.   Throw away unused or expired drugs. Do not flush down a toilet or pour down a drain unless you are told to do so. Check with your pharmacist if you have questions about the best way to throw out drugs. There may be drug take-back programs in your area.  General drug facts   If your symptoms or health problems do not get better or if they become worse, call your doctor.   Do not share your drugs with  others and do not take anyone else's drugs.   Keep a list of all your drugs (prescription, natural products, vitamins, OTC) with you. Give this list to your doctor.   Talk with the doctor before starting any new drug, including prescription or OTC, natural products, or vitamins.   Some drugs may have another patient information leaflet. If you have any questions about this drug, please talk with your doctor, nurse, pharmacist, or other health care provider.   If you think there has been an overdose, call your poison control center or get medical care right away. Be ready to tell or show what was taken, how much, and when it happened.   Amitriptyline tablets What is this medicine? AMITRIPTYLINE (a mee TRIP ti leen) is used to treat depression and migraines. This medicine may be used for other purposes; ask your health care provider or pharmacist if you have questions. COMMON BRAND NAME(S): Elavil, Vanatrip What should I tell my health care provider before I take this medicine? They need to know if you have any of these conditions: -an alcohol problem -asthma, difficulty breathing -bipolar disorder or schizophrenia -difficulty passing urine, prostate trouble -glaucoma -heart disease or previous heart attack -liver disease -over active thyroid -seizures -thoughts or plans of suicide, a previous suicide attempt, or family history of suicide attempt -an unusual or allergic reaction to amitriptyline, other medicines, foods, dyes, or preservatives -pregnant or trying to get pregnant -breast-feeding How should I use this medicine? Take this medicine by mouth with a drink of water. Follow the directions on the prescription label. You can take the tablets with or without food. Take your medicine at regular intervals. Do not take it more often than directed. Do not stop taking this medicine suddenly except upon the advice of your doctor. Stopping this medicine too quickly may cause serious side  effects or your condition may worsen. A special MedGuide will be given to you by the pharmacist with each  prescription and refill. Be sure to read this information carefully each time. Talk to your pediatrician regarding the use of this medicine in children. Special care may be needed. Overdosage: If you think you have taken too much of this medicine contact a poison control center or emergency room at once. NOTE: This medicine is only for you. Do not share this medicine with others. What if I miss a dose? If you miss a dose, take it as soon as you can. If it is almost time for your next dose, take only that dose. Do not take double or extra doses. What may interact with this medicine? Do not take this medicine with any of the following medications: -arsenic trioxide -certain medicines used to regulate abnormal heartbeat or to treat other heart conditions -cisapride -droperidol -halofantrine -linezolid -MAOIs like Carbex, Eldepryl, Marplan, Nardil, and Parnate -methylene blue -other medicines for mental depression -phenothiazines like perphenazine, thioridazine and chlorpromazine -pimozide -probucol -procarbazine -sparfloxacin -St. John's Wort -ziprasidone This medicine may also interact with the following medications: -atropine and related drugs like hyoscyamine, scopolamine, tolterodine and others -barbiturate medicines for inducing sleep or treating seizures, like phenobarbital -cimetidine -disulfiram -ethchlorvynol -thyroid hormones such as levothyroxine This list may not describe all possible interactions. Give your health care provider a list of all the medicines, herbs, non-prescription drugs, or dietary supplements you use. Also tell them if you smoke, drink alcohol, or use illegal drugs. Some items may interact with your medicine. What should I watch for while using this medicine? Tell your doctor if your symptoms do not get better or if they get worse. Visit your doctor or  health care professional for regular checks on your progress. Because it may take several weeks to see the full effects of this medicine, it is important to continue your treatment as prescribed by your doctor. Patients and their families should watch out for new or worsening thoughts of suicide or depression. Also watch out for sudden changes in feelings such as feeling anxious, agitated, panicky, irritable, hostile, aggressive, impulsive, severely restless, overly excited and hyperactive, or not being able to sleep. If this happens, especially at the beginning of treatment or after a change in dose, call your health care professional. Bonita Quin may get drowsy or dizzy. Do not drive, use machinery, or do anything that needs mental alertness until you know how this medicine affects you. Do not stand or sit up quickly, especially if you are an older patient. This reduces the risk of dizzy or fainting spells. Alcohol may interfere with the effect of this medicine. Avoid alcoholic drinks. Do not treat yourself for coughs, colds, or allergies without asking your doctor or health care professional for advice. Some ingredients can increase possible side effects. Your mouth may get dry. Chewing sugarless gum or sucking hard candy, and drinking plenty of water will help. Contact your doctor if the problem does not go away or is severe. This medicine may cause dry eyes and blurred vision. If you wear contact lenses you may feel some discomfort. Lubricating drops may help. See your eye doctor if the problem does not go away or is severe. This medicine can cause constipation. Try to have a bowel movement at least every 2 to 3 days. If you do not have a bowel movement for 3 days, call your doctor or health care professional. This medicine can make you more sensitive to the sun. Keep out of the sun. If you cannot avoid being in the sun, wear protective clothing and  use sunscreen. Do not use sun lamps or tanning  beds/booths. What side effects may I notice from receiving this medicine? Side effects that you should report to your doctor or health care professional as soon as possible: -allergic reactions like skin rash, itching or hives, swelling of the face, lips, or tongue -anxious -breathing problems -changes in vision -confusion -elevated mood, decreased need for sleep, racing thoughts, impulsive behavior -eye pain -fast, irregular heartbeat -feeling faint or lightheaded, falls -feeling agitated, angry, or irritable -fever with increased sweating -hallucination, loss of contact with reality -seizures -stiff muscles -suicidal thoughts or other mood changes -tingling, pain, or numbness in the feet or hands -trouble passing urine or change in the amount of urine -trouble sleeping -unusually weak or tired -vomiting -yellowing of the eyes or skin Side effects that usually do not require medical attention (report to your doctor or health care professional if they continue or are bothersome): -change in sex drive or performance -change in appetite or weight -constipation -dizziness -dry mouth -nausea -tired -tremors -upset stomach This list may not describe all possible side effects. Call your doctor for medical advice about side effects. You may report side effects to FDA at 1-800-FDA-1088. Where should I keep my medicine? Keep out of the reach of children. Store at room temperature between 20 and 25 degrees C (68 and 77 degrees F). Throw away any unused medicine after the expiration date. NOTE: This sheet is a summary. It may not cover all possible information. If you have questions about this medicine, talk to your doctor, pharmacist, or health care provider.  2018 Elsevier/Gold Standard (2015-08-21 12:14:15)

## 2018-03-21 ENCOUNTER — Telehealth: Payer: Self-pay | Admitting: Neurology

## 2018-03-21 ENCOUNTER — Other Ambulatory Visit: Payer: Self-pay | Admitting: Neurology

## 2018-03-21 MED ORDER — RIZATRIPTAN BENZOATE 10 MG PO TBDP: 10.0000 mg | ORAL_TABLET | ORAL | 11 refills | Status: DC | PRN

## 2018-03-21 NOTE — Telephone Encounter (Signed)
Patient states per insurance PA is need for Erenumab-aooe (AIMOVIG) 140 MG/ML SOAJ. He would also like a new Rx for dissolvable Maxalt [facility].

## 2018-03-21 NOTE — Telephone Encounter (Signed)
Sure done thanks.

## 2018-04-10 NOTE — Telephone Encounter (Signed)
[facility] and received latest ins information.   State Health Plan ID: [id number] BIN: [id number] PCN: [id number]: [id number]

## 2018-04-11 NOTE — Telephone Encounter (Addendum)
Aimovig 140 mg PA initiated on Cover My Meds KEY: [id number]. Awaiting plan to respond with clinical questions.

## 2018-04-11 NOTE — Telephone Encounter (Signed)
Called [facility] and spoke with Sam. She stated that insurance should pay for the medication on 04/12/2018. She will process the claim tomorrow morning and will contact our office if a PA is needed.

## 2018-04-11 NOTE — Telephone Encounter (Signed)
Received this message from Cover My Meds.   Your PA has been resolved, no additional PA is required.

## 2018-12-08 ENCOUNTER — Other Ambulatory Visit: Payer: Self-pay | Admitting: Neurology

## 2018-12-08 DIAGNOSIS — G43711 Chronic migraine without aura, intractable, with status migrainosus: Secondary | ICD-10-CM

## 2019-04-10 ENCOUNTER — Other Ambulatory Visit: Payer: Self-pay | Admitting: *Deleted

## 2019-04-10 MED ORDER — RIZATRIPTAN BENZOATE 10 MG PO TBDP: 10.0000 mg | ORAL_TABLET | ORAL | 0 refills | Status: DC | PRN

## 2019-05-13 ENCOUNTER — Other Ambulatory Visit: Payer: Self-pay | Admitting: Neurology

## 2019-12-13 ENCOUNTER — Other Ambulatory Visit: Payer: Self-pay | Admitting: Neurology

## 2019-12-13 DIAGNOSIS — G43711 Chronic migraine without aura, intractable, with status migrainosus: Secondary | ICD-10-CM

## 2019-12-26 ENCOUNTER — Other Ambulatory Visit: Payer: Self-pay | Admitting: Neurology

## 2019-12-26 DIAGNOSIS — G43711 Chronic migraine without aura, intractable, with status migrainosus: Secondary | ICD-10-CM

## 2019-12-31 ENCOUNTER — Telehealth: Payer: Self-pay | Admitting: Neurology

## 2019-12-31 DIAGNOSIS — G43711 Chronic migraine without aura, intractable, with status migrainosus: Secondary | ICD-10-CM

## 2019-12-31 MED ORDER — AIMOVIG 140 MG/ML ~~LOC~~ SOAJ: SUBCUTANEOUS | 0 refills | Status: DC

## 2019-12-31 NOTE — Telephone Encounter (Signed)
Pt hasn't been seen since September 2019. Need appt for refills.

## 2019-12-31 NOTE — Telephone Encounter (Signed)
Pt request refill AIMOVIG 140 MG/ML SOAJ and rizatriptan (MAXALT-MLT) 10 MG disintegrating tablet at [facility]

## 2019-12-31 NOTE — Telephone Encounter (Signed)
Spoke with pt. Offer f/u appt tomorrow. Pt unable to come. Scheduled pt for Thurs 01/16/20 at 1:30 pm w/ Amy NP. Pt just ran out of refills of Aimovig. I provided one pen to get him through appt, Dr Lucia Gaskins aware. Rizatriptan hasn't been prescribed in 8 months. Pt will be seen for further refills. He verbalized appreciation for the call.

## 2020-01-16 ENCOUNTER — Ambulatory Visit: Payer: 59 | Admitting: Family Medicine

## 2020-01-16 ENCOUNTER — Encounter: Payer: Self-pay | Admitting: Family Medicine

## 2020-01-16 VITALS — BP 116/79 | HR 80 | Ht 71.0 in | Wt 249.2 lb

## 2020-01-16 DIAGNOSIS — G43711 Chronic migraine without aura, intractable, with status migrainosus: Secondary | ICD-10-CM

## 2020-01-16 DIAGNOSIS — G43709 Chronic migraine without aura, not intractable, without status migrainosus: Secondary | ICD-10-CM | POA: Diagnosis not present

## 2020-01-16 MED ORDER — RIZATRIPTAN BENZOATE 10 MG PO TBDP: 10.0000 mg | ORAL_TABLET | ORAL | 0 refills | Status: DC | PRN

## 2020-01-16 MED ORDER — AIMOVIG 140 MG/ML ~~LOC~~ SOAJ: SUBCUTANEOUS | 3 refills | Status: DC

## 2020-01-16 NOTE — Progress Notes (Addendum)
Chief Complaint  Patient presents with  . Follow-up    72yr f/u for migraines. States his migraines have improved with Aimovig. Only 1-2 per month.   . room 2    alone      HISTORY OF PRESENT ILLNESS: Today 01/16/20  Carlos Bell is a 44 y.o. male here today for follow up for migraines. He has continued Amovig and rizatriptan. Last seen 12/2017 and advised to wean topiramate and start amitriptyline. He could not tolerate amitriptyline due to gastric upset.  He is not interested in sleep study. He sleeps well. He does snore from time to time. He wakes with headaches on occasion. He denies excessive daytime sleepiness. He is seen yearly for CPE with PCP.    HISTORY (copied from Dr Trevor Mace note on 12/13/2017)  Interval history 12/13/2017:  Recommended a sleep study for OSA but he did not schedule and declined when we called. Discussed again he needs a sleep test if he has morning headaches. He decreased Topamax to 50mg . He is under a lot of stress, he snores, is tired, wakes frequently. Topamax helped with headaches but having side effects.  He also has problems sleeping, can fall asleep at the "drop of a hat". He is in bed by 10 and wakes 7am. His brain is going all night never feels rested in the morning. Continued headaches.  HPI:  Carlos Bell is a 44 y.o. male here as a referral from Dr. 55 for headaches.  Past medical history hypertension, depression and anxiety. 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 He has a constant headache in the last year, wakes up with it, goes to bed with it, daily headaches. No previous history of headaches. Topamax has helped. He was taking them 4-6x a day (discussed rebound headache). Topamax has helped. The headaches are still daily but improved with the Topamax. He always wakes up with a headache. He snores heavily at night. He is tired throughout the day,  wakes up tired. The headaches are in the front and in the back, throbbing and pressure, he has significant light and sound sensitivity, has to lay down, movement makes it worse. Most days are moderate headaches and 8 or more are migrainous can last all day if untreated.   Reviewed notes, labs and imaging from outside physicians, which showed:  Personally reviewed images and agree with the following CT orbit/temporal: Clinical Data: 44 year old with right ear drainage for 2 years.  CT TEMPORAL BONES WITHOUT CONTRAST:  High resolution CT imaging of the temporal bones was performed. The external ear canal, tympanic membrane, and middle ear cavity appear normal on the right side. The bony ossicles are intact. There is no erosion of the scutum. There are no soft tissue masses, and the epitympanum is clear. The tegumen tympani is intact. Specifically no evidence for cholesteatoma. The inner ear structures appear normal. There are some scattered mastoid air cells inferiorly that are opacified which may suggest mild chronic mastoiditis. The mastoid antrum is clear. The left temporal bone is normal in appearance. The carotid canal and jugular foramen appear normal bilaterally. The visualized paranasal sinuses are clear.  IMPRESSION:  1. Mild probable chronic mastoiditis inferiorly on the right side. The mastoid antrum is clear. The middle ear cavity appears normal.  2. Normal appearance of the left temporal bone.  Reviewed referring physician notes.  Patient  presented headache intake.  Symptoms included headache and nausea, no vomiting have been ongoing for at least 11 hours.  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  Neurologic exam was normal    REVIEW OF SYSTEMS: Out of a complete 14 system review of symptoms, the patient complains only of the following symptoms, headaches and all other reviewed systems are negative.   ALLERGIES: Allergies  Allergen Reactions  . Zithromax  [Azithromycin] Rash     HOME MEDICATIONS: Outpatient Medications Prior to Visit  Medication Sig Dispense Refill  . Erenumab-aooe (AIMOVIG) 140 MG/ML SOAJ INJECT ONE PEN INTO THE SKIN EVERY 30 DAYS 1 mL 0  . rizatriptan (MAXALT-MLT) 10 MG disintegrating tablet Take 1 tablet (10 mg total) by mouth as needed for migraine. May repeat in 2 hours if needed. Must be seen for further refills, call [phone]. 9 tablet 0  . amitriptyline (ELAVIL) 10 MG tablet Take 1 tablet (10 mg total) by mouth at bedtime. 30 tablet 11  . topiramate (TOPAMAX) 50 MG tablet Take 1 tablet (50 mg total) by mouth at bedtime. 90 tablet 4   Facility-Administered Medications Prior to Visit  Medication Dose Route Frequency Provider Last Rate Last Admin  . gadopentetate dimeglumine (MAGNEVIST) injection 20 mL  20 mL Intravenous Once PRN Anson Fret, MD         PAST MEDICAL HISTORY: Past Medical History:  Diagnosis Date  . Headache   . Hypertension      PAST SURGICAL HISTORY: Past Surgical History:  Procedure Laterality Date  . NASAL SEPTUM SURGERY    . WISDOM TOOTH EXTRACTION       FAMILY HISTORY: Family History  Problem Relation Age of Onset  . Cervical cancer Mother   . Cancer Father      SOCIAL HISTORY: Social History   Socioeconomic History  . Marital status: Married    Spouse name: Not on file  . Number of children: 1  . Years of education: Not on file  . Highest education level: High school graduate  Occupational History  . Not on file  Tobacco Use  . Smoking status: Never Smoker  . Smokeless tobacco: Never Used  Vaping Use  . Vaping Use: Never used  Substance and Sexual Activity  . Alcohol use: Yes    Comment: occasional beer once a month  . Drug use: No  . Sexual activity: Not on file  Other Topics Concern  . Not on file  Social History Narrative   Lives at home with his wife and daughter   Right handed   Caffeine: 4 cups daily   Social Determinants of Health    Financial Resource Strain:   . Difficulty of Paying Living Expenses: Not on file  Food Insecurity:   . Worried About Programme researcher, broadcasting/film/video in the Last Year: Not on file  . Ran Out of Food in the Last Year: Not on file  Transportation Needs:   . Lack of Transportation (Medical): Not on file  . Lack of Transportation (Non-Medical): Not on file  Physical Activity:   . Days of Exercise per Week: Not on file  . Minutes of Exercise per Session: Not on file  Stress:   . Feeling of Stress : Not on file  Social Connections:   . Frequency of Communication with Friends and Family: Not on file  . Frequency of Social Gatherings with Friends and Family: Not on file  . Attends Religious Services: Not on file  . Active  Member of Clubs or Organizations: Not on file  . Attends Banker Meetings: Not on file  . Marital Status: Not on file  Intimate Partner Violence:   . Fear of Current or Ex-Partner: Not on file  . Emotionally Abused: Not on file  . Physically Abused: Not on file  . Sexually Abused: Not on file      PHYSICAL EXAM  Vitals:   01/16/20 1310  BP: 116/79  Pulse: 80  Weight: 249 lb 3.2 oz (113 kg)  Height: 5\' 11"  (1.803 m)   Body mass index is 34.76 kg/m.   Generalized: Well developed, in no acute distress   Neurological examination  Mentation: Alert oriented to time, place, history taking. Follows all commands speech and language fluent Cranial nerve II-XII: Pupils were equal round reactive to light. Extraocular movements were full, visual field were full on confrontational test. Facial sensation and strength were normal. Head turning and shoulder shrug  were normal and symmetric. Motor: The motor testing reveals 5 over 5 strength of all 4 extremities. Good symmetric motor tone is noted throughout.  Sensory: Sensory testing is intact to soft touch on all 4 extremities. No evidence of extinction is noted.  Coordination: Cerebellar testing reveals good  finger-nose-finger and heel-to-shin bilaterally.  Gait and station: Gait is normal.     DIAGNOSTIC DATA (LABS, IMAGING, TESTING) - I reviewed patient records, labs, notes, testing and imaging myself where available.  Lab Results  Component Value Date   WBC 7.9 06/01/2017   HGB 14.6 06/01/2017   HCT 43.2 06/01/2017   MCV 83 06/01/2017   PLT 194 06/01/2017      Component Value Date/Time   NA 147 (H) 06/01/2017 0813   K 4.4 06/01/2017 0813   CL 109 (H) 06/01/2017 0813   CO2 22 06/01/2017 0813   GLUCOSE 114 (H) 06/01/2017 0813   BUN 18 06/01/2017 0813   CREATININE 1.09 06/01/2017 0813   CALCIUM 9.2 06/01/2017 0813   PROT 6.5 06/01/2017 0813   ALBUMIN 4.3 06/01/2017 0813   AST 31 06/01/2017 0813   ALT 27 06/01/2017 0813   ALKPHOS 79 06/01/2017 0813   BILITOT 0.4 06/01/2017 0813   GFRNONAA 83 06/01/2017 0813   GFRAA 96 06/01/2017 0813   No results found for: CHOL, HDL, LDLCALC, LDLDIRECT, TRIG, CHOLHDL No results found for: 06/03/2017 No results found for: VITAMINB12 Lab Results  Component Value Date   TSH 0.616 06/01/2017      ASSESSMENT AND PLAN  44 y.o. year old male  has a past medical history of Headache and Hypertension. here with   Chronic migraine w/o aura w/o status migrainosus, not intractable  Carlos Bell is doing very well on Aimovig and rizatriptan.  We will continue current treatment plan.  We have discussed common findings and sleep apnea.  He may reach out to me should he change his mind about having a sleep study.  Healthy lifestyle habits encouraged.  He may follow-up with PCP for continued refills, otherwise, he will follow up with Augusto Gamble annually.  He verbalizes understanding and agreement with this plan.  I spent 20 minutes of face-to-face and non-face-to-face time with patient.  This included previsit chart review, lab review, study review, order entry, electronic health record documentation, patient education.    [place], MSN, FNP-C 01/16/2020, 1:22  PM  [facility] [address] [place], [place] [phone]  Made any corrections needed, and agree with history, physical, neuro exam,assessment and plan as  stated.     Sarina Ill, MD [facility]

## 2020-01-16 NOTE — Patient Instructions (Addendum)
We will continue Amovig and rizatriptan. Refills have been sent to your pharmacy.   Stay well hydrated. Well balanced diet and regular exercise encouraged.   Continue close follow up with PCP. Call if you wish to have sleep study. I have included info on sleep apnea below for you to review.   You may follow up with PCP for refills, otherwise, follow up with [place] in 1 year   Migraine Headache A migraine headache is a very strong throbbing pain on one side or both sides of your head. This type of headache can also cause other symptoms. It can last from 4 hours to 3 days. Talk with your doctor about what things may bring on (trigger) this condition. What are the causes? The exact cause of this condition is not known. This condition may be triggered or caused by:  Drinking alcohol.  Smoking.  Taking medicines, such as: ? Medicine used to treat chest pain (nitroglycerin). ? Birth control pills. ? Estrogen. ? Some blood pressure medicines.  Eating or drinking certain products.  Doing physical activity. Other things that may trigger a migraine headache include:  Having a menstrual period.  Pregnancy.  Hunger.  Stress.  Not getting enough sleep or getting too much sleep.  Weather changes.  Tiredness (fatigue). What increases the risk?  Being 34-48 years old.  Being male.  Having a family history of migraine headaches.  Being Caucasian.  Having depression or anxiety.  Being very overweight. What are the signs or symptoms?  A throbbing pain. This pain may: ? Happen in any area of the head, such as on one side or both sides. ? Make it hard to do daily activities. ? Get worse with physical activity. ? Get worse around bright lights or loud noises.  Other symptoms may include: ? Feeling sick to your stomach (nauseous). ? Vomiting. ? Dizziness. ? Being sensitive to bright lights, loud noises, or smells.  Before you get a migraine headache, you may get warning  signs (an aura). An aura may include: ? Seeing flashing lights or having blind spots. ? Seeing bright spots, halos, or zigzag lines. ? Having tunnel vision or blurred vision. ? Having numbness or a tingling feeling. ? Having trouble talking. ? Having weak muscles.  Some people have symptoms after a migraine headache (postdromal phase), such as: ? Tiredness. ? Trouble thinking (concentrating). How is this treated?  Taking medicines that: ? Relieve pain. ? Relieve the feeling of being sick to your stomach. ? Prevent migraine headaches.  Treatment may also include: ? Having acupuncture. ? Avoiding foods that bring on migraine headaches. ? Learning ways to control your body functions (biofeedback). ? Therapy to help you know and deal with negative thoughts (cognitive behavioral therapy). Follow these instructions at home: Medicines  Take over-the-counter and prescription medicines only as told by your doctor.  Ask your doctor if the medicine prescribed to you: ? Requires you to avoid driving or using heavy machinery. ? Can cause trouble pooping (constipation). You may need to take these steps to prevent or treat trouble pooping:  Drink enough fluid to keep your pee (urine) pale yellow.  Take over-the-counter or prescription medicines.  Eat foods that are high in fiber. These include beans, whole grains, and fresh fruits and vegetables.  Limit foods that are high in fat and sugar. These include fried or sweet foods. Lifestyle  Do not drink alcohol.  Do not use any products that contain nicotine or tobacco, such as cigarettes, e-cigarettes, and  chewing tobacco. If you need help quitting, ask your doctor.  Get at least 8 hours of sleep every night.  Limit and deal with stress. General instructions      Keep a journal to find out what may bring on your migraine headaches. For example, write down: ? What you eat and drink. ? How much sleep you get. ? Any change in what  you eat or drink. ? Any change in your medicines.  If you have a migraine headache: ? Avoid things that make your symptoms worse, such as bright lights. ? It may help to lie down in a dark, quiet room. ? Do not drive or use heavy machinery. ? Ask your doctor what activities are safe for you.  Keep all follow-up visits as told by your doctor. This is important. Contact a doctor if:  You get a migraine headache that is different or worse than others you have had.  You have more than 15 headache days in one month. Get help right away if:  Your migraine headache gets very bad.  Your migraine headache lasts longer than 72 hours.  You have a fever.  You have a stiff neck.  You have trouble seeing.  Your muscles feel weak or like you cannot control them.  You start to lose your balance a lot.  You start to have trouble walking.  You pass out (faint).  You have a seizure. Summary  A migraine headache is a very strong throbbing pain on one side or both sides of your head. These headaches can also cause other symptoms.  This condition may be treated with medicines and changes to your lifestyle.  Keep a journal to find out what may bring on your migraine headaches.  Contact a doctor if you get a migraine headache that is different or worse than others you have had.  Contact your doctor if you have more than 15 headache days in a month. This information is not intended to replace advice given to you by your health care provider. Make sure you discuss any questions you have with your health care provider. Document Revised: 07/13/2018 Document Reviewed: 05/03/2018 Elsevier Patient Education  2020 Elsevier Inc.    Sleep Apnea Sleep apnea affects breathing during sleep. It causes breathing to stop for a short time or to become shallow. It can also increase the risk of:  Heart attack.  Stroke.  Being very overweight (obese).  Diabetes.  Heart failure.  Irregular  heartbeat. The goal of treatment is to help you breathe normally again. What are the causes? There are three kinds of sleep apnea:  Obstructive sleep apnea. This is caused by a blocked or collapsed airway.  Central sleep apnea. This happens when the brain does not send the right signals to the muscles that control breathing.  Mixed sleep apnea. This is a combination of obstructive and central sleep apnea. The most common cause of this condition is a collapsed or blocked airway. This can happen if:  Your throat muscles are too relaxed.  Your tongue and tonsils are too large.  You are overweight.  Your airway is too small. What increases the risk?  Being overweight.  Smoking.  Having a small airway.  Being older.  Being male.  Drinking alcohol.  Taking medicines to calm yourself (sedatives or tranquilizers).  Having family members with the condition. What are the signs or symptoms?  Trouble staying asleep.  Being sleepy or tired during the day.  Getting angry a lot.  Loud snoring.  Headaches in the morning.  Not being able to focus your mind (concentrate).  Forgetting things.  Less interest in sex.  Mood swings.  Personality changes.  Feelings of sadness (depression).  Waking up a lot during the night to pee (urinate).  Dry mouth.  Sore throat. How is this diagnosed?  Your medical history.  A physical exam.  A test that is done when you are sleeping (sleep study). The test is most often done in a sleep lab but may also be done at home. How is this treated?   Sleeping on your side.  Using a medicine to get rid of mucus in your nose (decongestant).  Avoiding the use of alcohol, medicines to help you relax, or certain pain medicines (narcotics).  Losing weight, if needed.  Changing your diet.  Not smoking.  Using a machine to open your airway while you sleep, such as: ? An oral appliance. This is a mouthpiece that shifts your lower  jaw forward. ? A CPAP device. This device blows air through a mask when you breathe out (exhale). ? An EPAP device. This has valves that you put in each nostril. ? A BPAP device. This device blows air through a mask when you breathe in (inhale) and breathe out.  Having surgery if other treatments do not work. It is important to get treatment for sleep apnea. Without treatment, it can lead to:  High blood pressure.  Coronary artery disease.  In men, not being able to have an erection (impotence).  Reduced thinking ability. Follow these instructions at home: Lifestyle  Make changes that your doctor recommends.  Eat a healthy diet.  Lose weight if needed.  Avoid alcohol, medicines to help you relax, and some pain medicines.  Do not use any products that contain nicotine or tobacco, such as cigarettes, e-cigarettes, and chewing tobacco. If you need help quitting, ask your doctor. General instructions  Take over-the-counter and prescription medicines only as told by your doctor.  If you were given a machine to use while you sleep, use it only as told by your doctor.  If you are having surgery, make sure to tell your doctor you have sleep apnea. You may need to bring your device with you.  Keep all follow-up visits as told by your doctor. This is important. Contact a doctor if:  The machine that you were given to use during sleep bothers you or does not seem to be working.  You do not get better.  You get worse. Get help right away if:  Your chest hurts.  You have trouble breathing in enough air.  You have an uncomfortable feeling in your back, arms, or stomach.  You have trouble talking.  One side of your body feels weak.  A part of your face is hanging down. These symptoms may be an emergency. Do not wait to see if the symptoms will go away. Get medical help right away. Call your local emergency services (911 in the [place] Do not drive yourself to the  hospital. Summary  This condition affects breathing during sleep.  The most common cause is a collapsed or blocked airway.  The goal of treatment is to help you breathe normally while you sleep. This information is not intended to replace advice given to you by your health care provider. Make sure you discuss any questions you have with your health care provider. Document Revised: 01/05/2018 Document Reviewed: 11/14/2017 Elsevier Patient Education  2020 [employer].

## 2020-02-03 ENCOUNTER — Telehealth: Payer: Self-pay

## 2020-02-03 NOTE — Telephone Encounter (Signed)
A PA sent via [facility]MM  (KeyElenora Fender) Rx #: [id number] Aimovig 140MG /ML auto-injectors  Pending   Wait for Determination Please wait for [employer] NCPDP 2017 to return a determination.

## 2020-02-05 NOTE — Telephone Encounter (Signed)
A PA for Aimovig 140MG /ML [place] SOAJ is approved.  As long as you remain covered by your prescription drug plan and there are no changes to your plan benefits, this request is approved for the following time period: 02/04/2020 - 02/03/2021

## 2020-03-11 ENCOUNTER — Other Ambulatory Visit: Payer: Self-pay | Admitting: *Deleted

## 2020-03-11 MED ORDER — RIZATRIPTAN BENZOATE 10 MG PO TBDP: 10.0000 mg | ORAL_TABLET | ORAL | 5 refills | Status: DC | PRN

## 2020-05-07 NOTE — Telephone Encounter (Signed)
Received another Aimovig PA request. Completed this on Cover My Meds. Key: [id number]. Awaiting determination from [employer].

## 2020-05-11 NOTE — Telephone Encounter (Signed)
Submitted request for additional information to [facility] at [phone]

## 2020-05-14 NOTE — Telephone Encounter (Signed)
Spoke to pt relayed the aimovig not covered by insurance.  Can try using card, gave him [email or web address] and if not able to use then switch to ajovy.  He c/o about insurance.  I gave him options again.  He hung up.

## 2020-05-14 NOTE — Telephone Encounter (Signed)
May try access card. If that does not work, we can switch to [employer].

## 2020-05-14 NOTE — Telephone Encounter (Signed)
Received denial from [facility]. Patient must try and fail Ajovy AND Emgality first.    Appeals can be faxed to [phone] (or phone number [phone])  (fyi, we do not have an updated insurance card scanned in the chart)

## 2021-03-03 ENCOUNTER — Telehealth: Payer: Self-pay | Admitting: Family Medicine

## 2021-03-03 MED ORDER — RIZATRIPTAN BENZOATE 10 MG PO TBDP: 10.0000 mg | ORAL_TABLET | ORAL | 0 refills | Status: DC | PRN

## 2021-03-03 NOTE — Telephone Encounter (Signed)
Contacted patient to schedule f/u. Pt stated, will call back to schedule appt after I look at my schedule.  Informed patient may not be able to get refills if f/u is not scheduled. Pt verbalized understanding.

## 2021-03-03 NOTE — Telephone Encounter (Signed)
Pt called needing a refill request for his rizatriptan (MAXALT-MLT) 10 MG disintegrating tablet sent to the [facility]

## 2021-05-31 ENCOUNTER — Encounter: Payer: Self-pay | Admitting: Family Medicine

## 2021-05-31 ENCOUNTER — Ambulatory Visit: Payer: BC Managed Care – PPO | Admitting: Family Medicine

## 2021-05-31 VITALS — BP 124/88 | HR 76 | Ht 71.0 in | Wt 269.0 lb

## 2021-05-31 DIAGNOSIS — G43709 Chronic migraine without aura, not intractable, without status migrainosus: Secondary | ICD-10-CM | POA: Diagnosis not present

## 2021-05-31 MED ORDER — RIZATRIPTAN BENZOATE 10 MG PO TBDP: 10.0000 mg | ORAL_TABLET | ORAL | 11 refills | Status: AC | PRN

## 2021-05-31 NOTE — Patient Instructions (Signed)
Below is our plan:  We will continue rizatriptan as needed   Please make sure you are staying well hydrated. I recommend 50-60 ounces daily. Well balanced diet and regular exercise encouraged. Consistent sleep schedule with 6-8 hours recommended.   Please continue follow up with care team as directed.   Follow up with me as needed   You may receive a survey regarding today's visit. I encourage you to leave honest feed back as I do use this information to improve patient care. Thank you for seeing me today!

## 2021-05-31 NOTE — Progress Notes (Signed)
Chief Complaint  Patient presents with   Follow-up    RM 16, alone. Not taking aimovig since insurance wouldn't cover it. Maxalt helps PRN.     HISTORY OF PRESENT ILLNESS: 05/31/21 ALL: Carlos Bell returns for follow up for migraines. He was last seen 01/2020 and advised to continue Amovig and rizatriptan. He declined sleep eval. He reports that Amovig was not covered by insurance and he discontinued. He feels that rizatriptan helps knock out migraines. Usually takes 4-5 doses. Headaches can occur any time during the day. Stress is most common trigger. He does not feel apnea testing is needed at this time.   01/16/2020 ALL:  Carlos Bell is a 46 y.o. male here today for follow up for migraines. He has continued Amovig and rizatriptan. Last seen 12/2017 and advised to wean topiramate and start amitriptyline. He could not tolerate amitriptyline due to gastric upset.  He is not interested in sleep study. He sleeps well. He does snore from time to time. He wakes with headaches on occasion. He denies excessive daytime sleepiness. He is seen yearly for CPE with PCP.    HISTORY (copied from Dr Trevor Mace note on 12/13/2017)  Interval history 12/13/2017:  Recommended a sleep study for OSA but he did not schedule and declined when we called. Discussed again he needs a sleep test if he has morning headaches. He decreased Topamax to 50mg . He is under a lot of stress, he snores, is tired, wakes frequently. Topamax helped with headaches but having side effects.  He also has problems sleeping, can fall asleep at the "drop of a hat". He is in bed by 10 and wakes 7am. His brain is going all night never feels rested in the morning. Continued headaches.   HPI:  Carlos Bell is a 46 y.o. male here as a referral from Dr. Sherril Croon for headaches.  Past medical history hypertension, depression and anxiety. 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 He has a constant headache in the last year, wakes up with it, goes to bed with it, daily headaches. No previous history of headaches. Topamax has helped. He was taking them 4-6x a day (discussed rebound headache). Topamax has helped. The headaches are still daily but improved with the Topamax. He always wakes up with a headache. He snores heavily at night. He is tired throughout the day, wakes up tired. The headaches are in the front and in the back, throbbing and pressure, he has significant light and sound sensitivity, has to lay down, movement makes it worse. Most days are moderate headaches and 8 or more are migrainous can last all day if untreated.    Reviewed notes, labs and imaging from outside physicians, which showed:   Personally reviewed images and agree with the following CT orbit/temporal: Clinical Data: 46 year old with right ear drainage for 2 years.  CT TEMPORAL BONES WITHOUT CONTRAST:  High resolution CT imaging of the temporal bones was performed. The external ear canal, tympanic membrane, and middle ear cavity appear normal on the right side. The bony ossicles are intact. There is no erosion of the scutum. There are no soft tissue masses, and the epitympanum is clear. The tegumen tympani is intact. Specifically no evidence for cholesteatoma. The inner ear structures appear normal. There are some scattered mastoid air cells inferiorly that are opacified which may suggest mild chronic mastoiditis. The  mastoid antrum is clear. The left temporal bone is normal in appearance. The carotid canal and jugular foramen appear normal bilaterally. The visualized paranasal sinuses are clear.  IMPRESSION:  1. Mild probable chronic mastoiditis inferiorly on the right side. The mastoid antrum is clear. The middle ear cavity appears normal.   2. Normal appearance of the left temporal bone.   Reviewed referring physician notes.  Patient presented headache intake.  Symptoms included  headache and nausea, no vomiting have been ongoing for at least 11 hours.  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  Neurologic exam was normal   REVIEW OF SYSTEMS: Out of a complete 14 system review of symptoms, the patient complains only of the following symptoms, headaches and all other reviewed systems are negative.   ALLERGIES: Allergies  Allergen Reactions   Zithromax [Azithromycin] Rash     HOME MEDICATIONS: Outpatient Medications Prior to Visit  Medication Sig Dispense Refill   rizatriptan (MAXALT-MLT) 10 MG disintegrating tablet Take 1 tablet (10 mg total) by mouth as needed for migraine. Appointment is required for further refills. Please call [phone] and make an appt 9 tablet 0   Erenumab-aooe (AIMOVIG) 140 MG/ML SOAJ INJECT ONE PEN INTO THE SKIN EVERY 30 DAYS 3 mL 3   No facility-administered medications prior to visit.     PAST MEDICAL HISTORY: Past Medical History:  Diagnosis Date   Headache    Hypertension      PAST SURGICAL HISTORY: Past Surgical History:  Procedure Laterality Date   NASAL SEPTUM SURGERY     WISDOM TOOTH EXTRACTION       FAMILY HISTORY: Family History  Problem Relation Age of Onset   Cervical cancer Mother    Cancer Father      SOCIAL HISTORY: Social History   Socioeconomic History   Marital status: Married    Spouse name: Not on file   Number of children: 1   Years of education: Not on file   Highest education level: High school graduate  Occupational History   Not on file  Tobacco Use   Smoking status: Never   Smokeless tobacco: Never  Vaping Use   Vaping Use: Never used  Substance and Sexual Activity   Alcohol use: Yes    Comment: occasional beer once a month   Drug use: No   Sexual activity: Not on file  Other Topics Concern   Not on file  Social History Narrative   Lives at home with his wife and daughter   Right handed   Caffeine: 4 cups daily   Social Determinants of Health    Financial Resource Strain: Not on file  Food Insecurity: Not on file  Transportation Needs: Not on file  Physical Activity: Not on file  Stress: Not on file  Social Connections: Not on file  Intimate Partner Violence: Not on file      PHYSICAL EXAM  Vitals:   05/31/21 1307  BP: 124/88  Pulse: 76  Weight: 269 lb (122 kg)  Height: 5\' 11"  (1.803 m)    Body mass index is 37.52 kg/m.   Generalized: Well developed, in no acute distress   Neurological examination  Mentation: Alert oriented to time, place, history taking. Follows all commands speech and language fluent Cranial nerve II-XII: Pupils were equal round reactive to light. Extraocular movements were full, visual field were full on confrontational test. Facial sensation and strength were normal. Head turning and shoulder shrug  were normal and symmetric. Motor: The motor  testing reveals 5 over 5 strength of all 4 extremities. Good symmetric motor tone is noted throughout.  Gait and station: Gait is normal.    DIAGNOSTIC DATA (LABS, IMAGING, TESTING) - I reviewed patient records, labs, notes, testing and imaging myself where available.  Lab Results  Component Value Date   WBC 7.9 06/01/2017   HGB 14.6 06/01/2017   HCT 43.2 06/01/2017   MCV 83 06/01/2017   PLT 194 06/01/2017      Component Value Date/Time   NA 147 (H) 06/01/2017 0813   K 4.4 06/01/2017 0813   CL 109 (H) 06/01/2017 0813   CO2 22 06/01/2017 0813   GLUCOSE 114 (H) 06/01/2017 0813   BUN 18 06/01/2017 0813   CREATININE 1.09 06/01/2017 0813   CALCIUM 9.2 06/01/2017 0813   PROT 6.5 06/01/2017 0813   ALBUMIN 4.3 06/01/2017 0813   AST 31 06/01/2017 0813   ALT 27 06/01/2017 0813   ALKPHOS 79 06/01/2017 0813   BILITOT 0.4 06/01/2017 0813   GFRNONAA 83 06/01/2017 0813   GFRAA 96 06/01/2017 0813   No results found for: CHOL, HDL, LDLCALC, LDLDIRECT, TRIG, CHOLHDL No results found for: RWER1V No results found for: VITAMINB12 Lab Results   Component Value Date   TSH 0.616 06/01/2017      ASSESSMENT AND PLAN  46 y.o. year old male  has a past medical history of Headache and Hypertension. here with   Chronic migraine w/o aura w/o status migrainosus, not intractable  Carlos Bell is doing very well on rizatriptan.  We will continue current treatment plan.  We have discussed common findings and sleep apnea.  He may reach out to me should he change his mind about having a sleep study.  Healthy lifestyle habits encouraged.  He may follow-up with PCP for continued refills. Follow up with me as needed. He verbalizes understanding and agreement with this plan.   Shawnie Dapper, MSN, FNP-C 05/31/2021, 1:36 PM  [facility] [address] [place], [place] [address] [phone]

## 2021-06-02 ENCOUNTER — Telehealth: Payer: Self-pay

## 2021-06-02 NOTE — Telephone Encounter (Signed)
PA for rizatriptan ODT has been sent via cover my meds. ?(Key: [id number]) ?Your information has been submitted to [employer]. [employer] will review the request and notify you of the determination decision directly, typically within 72 hours of receiving all information. ? ?You will also receive your request decision electronically. To check for an update later, open this request again from your dashboard. ? ?If [employer] has not responded within the specified timeframe or if you have any questions about your PA submission, contact [facility] [facility] directly at [phone]. ?

## 2021-06-09 NOTE — Telephone Encounter (Signed)
Carlos Bell, received denial letter for rizatriptan ODT from insurance. They want him to try/fail ALL formulary options: generic sumatriptan, naratriptan, almotriptan.  ? ?I reviewed his chart and do not see where he has tried above formulary options. Did you want to switch him to one of these?  ?I checked goodrx and the cheapest option would be [facility] 15.91/30days.  ?[facility] only have one time offer that is 5-6 dollars/30days ?

## 2021-06-09 NOTE — Telephone Encounter (Signed)
Called the patient back. I advised that we received a denial for the medication. Pt states this medication really works for him. The pharmacy was able to fill it for him for 15$.  ?Pt states that he picked it up. I advised that if that worked than he can keep getting it. Informed him that if next month there is an issue, he can call us and we can always forward to a more affordable option through [email or web address] if needed. ? ?Pt would like to continue with what he is on. ?
